# Patient Record
Sex: Female | Born: 1955
Health system: Southern US, Community
[De-identification: ages and names within clinical notes are randomized; demographics above are authoritative.]

## PROBLEM LIST (undated history)

## (undated) DIAGNOSIS — T7840XA Allergy, unspecified, initial encounter: Secondary | ICD-10-CM

## (undated) DIAGNOSIS — R87619 Unspecified abnormal cytological findings in specimens from cervix uteri: Secondary | ICD-10-CM

## (undated) DIAGNOSIS — G43909 Migraine, unspecified, not intractable, without status migrainosus: Secondary | ICD-10-CM

## (undated) DIAGNOSIS — E785 Hyperlipidemia, unspecified: Secondary | ICD-10-CM

## (undated) DIAGNOSIS — M1990 Unspecified osteoarthritis, unspecified site: Secondary | ICD-10-CM

## (undated) DIAGNOSIS — G43009 Migraine without aura, not intractable, without status migrainosus: Secondary | ICD-10-CM

## (undated) DIAGNOSIS — E049 Nontoxic goiter, unspecified: Secondary | ICD-10-CM

## (undated) DIAGNOSIS — E079 Disorder of thyroid, unspecified: Secondary | ICD-10-CM

## (undated) HISTORY — DX: Migraine without aura, not intractable, without status migrainosus: G43.009

## (undated) HISTORY — DX: Allergy, unspecified, initial encounter: T78.40XA

## (undated) HISTORY — DX: Nontoxic goiter, unspecified: E04.9

## (undated) HISTORY — DX: Disorder of thyroid, unspecified: E07.9

## (undated) HISTORY — DX: Unspecified osteoarthritis, unspecified site: M19.90

## (undated) HISTORY — PX: COLONOSCOPY: SHX174

## (undated) HISTORY — DX: Unspecified abnormal cytological findings in specimens from cervix uteri: R87.619

## (undated) HISTORY — DX: Migraine, unspecified, not intractable, without status migrainosus: G43.909

## (undated) HISTORY — DX: Hyperlipidemia, unspecified: E78.5

---

## 1988-03-04 HISTORY — PX: CERVIX LESION DESTRUCTION: SHX591

## 1999-05-09 ENCOUNTER — Ambulatory Visit (HOSPITAL_COMMUNITY): Admission: RE | Admit: 1999-05-09 | Discharge: 1999-05-09 | Payer: Self-pay | Admitting: *Deleted

## 1999-05-09 ENCOUNTER — Encounter: Payer: Self-pay | Admitting: *Deleted

## 2000-08-19 ENCOUNTER — Other Ambulatory Visit: Admission: RE | Admit: 2000-08-19 | Discharge: 2000-08-19 | Payer: Self-pay | Admitting: Radiology

## 2003-11-21 ENCOUNTER — Ambulatory Visit (HOSPITAL_COMMUNITY): Admission: RE | Admit: 2003-11-21 | Discharge: 2003-11-21 | Payer: Self-pay | Admitting: *Deleted

## 2004-06-18 ENCOUNTER — Other Ambulatory Visit: Admission: RE | Admit: 2004-06-18 | Discharge: 2004-06-18 | Payer: Self-pay | Admitting: *Deleted

## 2004-12-27 ENCOUNTER — Encounter: Admission: RE | Admit: 2004-12-27 | Discharge: 2004-12-27 | Payer: Self-pay | Admitting: *Deleted

## 2005-06-26 ENCOUNTER — Other Ambulatory Visit: Admission: RE | Admit: 2005-06-26 | Discharge: 2005-06-26 | Payer: Self-pay | Admitting: *Deleted

## 2006-02-05 ENCOUNTER — Encounter: Admission: RE | Admit: 2006-02-05 | Discharge: 2006-02-05 | Payer: Self-pay | Admitting: *Deleted

## 2006-07-30 ENCOUNTER — Other Ambulatory Visit: Admission: RE | Admit: 2006-07-30 | Discharge: 2006-07-30 | Payer: Self-pay | Admitting: *Deleted

## 2007-02-10 ENCOUNTER — Encounter: Admission: RE | Admit: 2007-02-10 | Discharge: 2007-02-10 | Payer: Self-pay | Admitting: *Deleted

## 2007-03-05 HISTORY — PX: COLONOSCOPY: SHX174

## 2007-11-20 ENCOUNTER — Ambulatory Visit: Payer: Self-pay | Admitting: Gastroenterology

## 2007-12-04 ENCOUNTER — Encounter: Payer: Self-pay | Admitting: Gastroenterology

## 2007-12-04 ENCOUNTER — Ambulatory Visit: Payer: Self-pay | Admitting: Gastroenterology

## 2007-12-07 ENCOUNTER — Encounter: Payer: Self-pay | Admitting: Gastroenterology

## 2008-03-15 ENCOUNTER — Encounter: Admission: RE | Admit: 2008-03-15 | Discharge: 2008-03-15 | Payer: Self-pay | Admitting: Internal Medicine

## 2009-04-20 ENCOUNTER — Encounter: Admission: RE | Admit: 2009-04-20 | Discharge: 2009-04-20 | Payer: Self-pay | Admitting: Internal Medicine

## 2010-03-04 HISTORY — PX: BLEPHAROPLASTY: SUR158

## 2010-03-25 ENCOUNTER — Encounter: Payer: Self-pay | Admitting: Internal Medicine

## 2010-05-09 ENCOUNTER — Other Ambulatory Visit: Payer: Self-pay | Admitting: Internal Medicine

## 2010-05-09 DIAGNOSIS — Z1239 Encounter for other screening for malignant neoplasm of breast: Secondary | ICD-10-CM

## 2010-05-17 ENCOUNTER — Ambulatory Visit
Admission: RE | Admit: 2010-05-17 | Discharge: 2010-05-17 | Disposition: A | Discharge status: Home / Self Care | Payer: 59 | Source: Ambulatory Visit | Attending: Internal Medicine | Admitting: Internal Medicine

## 2010-05-17 DIAGNOSIS — Z1239 Encounter for other screening for malignant neoplasm of breast: Secondary | ICD-10-CM

## 2011-06-18 ENCOUNTER — Other Ambulatory Visit: Payer: Self-pay | Admitting: Internal Medicine

## 2011-06-18 DIAGNOSIS — Z1231 Encounter for screening mammogram for malignant neoplasm of breast: Secondary | ICD-10-CM

## 2011-07-10 ENCOUNTER — Ambulatory Visit
Admission: RE | Admit: 2011-07-10 | Discharge: 2011-07-10 | Disposition: A | Discharge status: Home / Self Care | Payer: BC Managed Care – PPO | Source: Ambulatory Visit | Attending: Internal Medicine | Admitting: Internal Medicine

## 2011-07-10 DIAGNOSIS — Z1231 Encounter for screening mammogram for malignant neoplasm of breast: Secondary | ICD-10-CM

## 2012-10-02 ENCOUNTER — Telehealth: Payer: Self-pay | Admitting: Obstetrics & Gynecology

## 2012-10-02 ENCOUNTER — Ambulatory Visit (INDEPENDENT_AMBULATORY_CARE_PROVIDER_SITE_OTHER): Payer: BC Managed Care – PPO | Admitting: Obstetrics and Gynecology

## 2012-10-02 VITALS — BP 102/60 | HR 68 | Resp 12 | Ht 65.0 in | Wt 158.0 lb

## 2012-10-02 DIAGNOSIS — N898 Other specified noninflammatory disorders of vagina: Secondary | ICD-10-CM

## 2012-10-02 MED ORDER — ESTROGENS, CONJUGATED 0.625 MG/GM VA CREA: TOPICAL_CREAM | Freq: Every day | VAGINAL | Status: DC

## 2012-10-02 NOTE — Telephone Encounter (Signed)
Patient states having vaginal discharge . Is currently out of town and will be back in town @ 2:30pm. Appointment given for Dr. Tresa Res @ 3:15pm today.

## 2012-10-02 NOTE — Telephone Encounter (Signed)
Pt is having pain and discharge. Please call to schedule an appointment.

## 2012-10-02 NOTE — Progress Notes (Signed)
57 yo MWF with three week history of vaginal discharge and painful sex.  No HRT ever.  No new sex partner.  Has tried KY jelly and that works "for a while" and then sex is painful half way through the experience.  No h/o recurrent vaginitis. Last sexual activity one week ago.  Exam:  Ext nl.  Vag with decreased rugae; pink; cx appears nl;no excessive discharge.  BUS neg.  BM:  Uterus small, nt, mobile.  Adnexa neg.  Wet prep:  PH= 5.5.  Saline neg.  KOH neg.  A:  Vaginal atrophy  P:  Discussed dx with pt.  Options of tx discussed.  Rec:  Olive oil for lubrication.  Offered and accepted trial of vag E2 cream.  Prem cream 1/2 g qhs for 2 weeks then 2-3 times per week.  Questions answered.

## 2012-10-02 NOTE — Patient Instructions (Signed)
Use the estrogen cream inside your vagina as directed.  If after 8 weeks of use, you do not feel better, please come back in.

## 2012-10-05 ENCOUNTER — Ambulatory Visit: Payer: Self-pay | Admitting: Gynecology

## 2013-04-14 ENCOUNTER — Ambulatory Visit: Payer: Self-pay | Admitting: Obstetrics & Gynecology

## 2013-04-16 ENCOUNTER — Ambulatory Visit: Payer: Self-pay | Admitting: Obstetrics & Gynecology

## 2013-05-13 ENCOUNTER — Ambulatory Visit: Payer: Self-pay | Admitting: Obstetrics & Gynecology

## 2013-07-08 ENCOUNTER — Ambulatory Visit: Payer: Self-pay | Admitting: Obstetrics & Gynecology

## 2013-07-13 ENCOUNTER — Encounter: Payer: Self-pay | Admitting: Obstetrics & Gynecology

## 2013-07-13 ENCOUNTER — Ambulatory Visit (INDEPENDENT_AMBULATORY_CARE_PROVIDER_SITE_OTHER): Payer: BC Managed Care – PPO | Admitting: Obstetrics & Gynecology

## 2013-07-13 VITALS — BP 110/80 | HR 66 | Temp 98.6°F | Ht 65.5 in | Wt 168.0 lb

## 2013-07-13 DIAGNOSIS — N9089 Other specified noninflammatory disorders of vulva and perineum: Secondary | ICD-10-CM

## 2013-07-13 DIAGNOSIS — Z0000 Encounter for general adult medical examination without abnormal findings: Secondary | ICD-10-CM

## 2013-07-13 DIAGNOSIS — E039 Hypothyroidism, unspecified: Secondary | ICD-10-CM | POA: Insufficient documentation

## 2013-07-13 DIAGNOSIS — Z01419 Encounter for gynecological examination (general) (routine) without abnormal findings: Secondary | ICD-10-CM

## 2013-07-13 LAB — POCT URINALYSIS DIPSTICK
BILIRUBIN UA: NEGATIVE
Blood, UA: NEGATIVE
GLUCOSE UA: NEGATIVE
KETONES UA: NEGATIVE
Nitrite, UA: NEGATIVE
PH UA: 5.5
PROTEIN UA: NEGATIVE
UROBILINOGEN UA: NEGATIVE

## 2013-07-13 MED ORDER — ESTRADIOL 10 MCG VA TABS: 1.0000 | ORAL_TABLET | VAGINAL | Status: DC

## 2013-07-13 NOTE — Patient Instructions (Signed)

## 2013-07-13 NOTE — Progress Notes (Signed)
58 y.o. G0P0000 Married CaucasianF here for annual exam.  No vaginal bleeding since 2012.  Has a second home in [place], [place].  Labs with Dr. Brigitte Pulse.  Patient's last menstrual period was 12/03/2010.          Sexually active: yes  The current method of family planning is none.    Exercising: yes  walking and weights Smoker:  no  Health Maintenance: Pap:  01/09/12 WNL/negative HR HPV History of abnormal Pap:  Yes h/o cone bx, laser, and chemical tx MMG:  07/10/11-normal, pt aware due Colonoscopy:  2009-repeat in 10 years BMD:   2013 TDaP:  Up to date with Dr Brigitte Pulse Screening Labs: PCP, Hb today: PCP, Urine today: WBC-1+, PH-5.5   reports that she has never smoked. She has never used smokeless tobacco. She reports that she drinks about 1 - 1.5 ounces of alcohol per week. She reports that she does not use illicit drugs.  Past Medical History  Diagnosis Date  . Thyroid disease     hypothyroidism  . Migraine   . Abnormal Pap smear of cervix     chemicl tx, cone bx, and laser    Past Surgical History  Procedure Laterality Date  . Cervix lesion destruction  1990    chemical tx'd x2, cone bx and laser  . Thyroid goiter  2002    multinodular goiter--negative bx     Current Outpatient Prescriptions  Medication Sig Dispense Refill  . BIOTIN PO Take by mouth daily.      . fexofenadine (ALLEGRA) 180 MG tablet Take 180 mg by mouth as needed.      Marland Kitchen levothyroxine (SYNTHROID, LEVOTHROID) 125 MCG tablet Take 125 mcg by mouth daily before breakfast.      . conjugated estrogens (PREMARIN) vaginal cream Place vaginally daily. 1/2 g pv qhs x 2 wks, then 1/2 g pv 2-3 times q wk  30 g  6   No current facility-administered medications for this visit.    Family History  Problem Relation Age of Onset  . Parkinson's disease Brother     ROS:  Pertinent items are noted in HPI.  Otherwise, a comprehensive ROS was negative.  Exam:   BP 110/80  Pulse 66  Temp(Src) 98.6 F (37 C)  Ht 5'  5.5" (1.664 m)  Wt 168 lb (76.204 kg)  BMI 27.52 kg/m2  LMP 12/03/2010    Height: 5' 5.5" (166.4 cm)  Ht Readings from Last 3 Encounters:  07/13/13 5' 5.5" (1.664 m)  10/02/12 5\' 5"  (1.651 m)    General appearance: alert, cooperative and appears stated age Head: Normocephalic, without obvious abnormality, atraumatic Neck: no adenopathy, supple, symmetrical, trachea midline and thyroid normal to inspection and palpation Lungs: clear to auscultation bilaterally Breasts: normal appearance, no masses or tenderness Heart: regular rate and rhythm Abdomen: soft, non-tender; bowel sounds normal; no masses,  no organomegaly Extremities: extremities normal, atraumatic, no cyanosis or edema Skin: Skin color, texture, turgor normal. No rashes or lesions Lymph nodes: Cervical, supraclavicular, and axillary nodes normal. No abnormal inguinal nodes palpated Neurologic: Grossly normal   Pelvic: External genitalia:  no lesions except for wart like lesion below left labia majora              Urethra:  normal appearing urethra with no masses, tenderness or lesions              Bartholins and Skenes: normal  Vagina: normal appearing vagina with normal color and discharge, no lesions              Cervix: no lesions              Pap taken: no Bimanual Exam:  Uterus:  normal size, contour, position, consistency, mobility, non-tender              Adnexa: normal adnexa and no mass, fullness, tenderness               Rectovaginal: Confirms               Anus:  normal sphincter tone, no lesions  Vulvar biopsy note:  Verbal consent obtained.  Area cleansed with Betadine x 3.  1/2 cc 1% Lidocaine instilled.  Lesion excised with sterile technique.  Silver nitrate applied for hemostasis.  Pt tolerated procedure well.    A:  Well Woman with normal exam PMP, no HRT Remote hx of abnormal Pap smear, 20 years ago Hypothyroidism Sun damage Atypical migraine Vaginal atrophic changes  P:    Mammogram yearly.  Pt knows due.   pap smear with neg HR HPV 11/13 D/W pt other options for atrophic changes, beside estrogen cream, due to h/o atypical migraine.  Will try Vagifem 64meq tablets twice weekly.  Rx to pharmacy. Vulvar biopsy pending. return annually or prn  An After Visit Summary was printed and given to the patient.

## 2013-07-20 ENCOUNTER — Other Ambulatory Visit: Payer: Self-pay

## 2013-07-20 DIAGNOSIS — Z1231 Encounter for screening mammogram for malignant neoplasm of breast: Secondary | ICD-10-CM

## 2013-08-10 ENCOUNTER — Ambulatory Visit
Admission: RE | Admit: 2013-08-10 | Discharge: 2013-08-10 | Disposition: A | Discharge status: Home / Self Care | Payer: BC Managed Care – PPO | Source: Ambulatory Visit

## 2013-08-10 DIAGNOSIS — Z1231 Encounter for screening mammogram for malignant neoplasm of breast: Secondary | ICD-10-CM

## 2013-08-17 ENCOUNTER — Ambulatory Visit (INDEPENDENT_AMBULATORY_CARE_PROVIDER_SITE_OTHER): Payer: BC Managed Care – PPO | Admitting: Certified Nurse Midwife

## 2013-08-17 ENCOUNTER — Encounter: Payer: Self-pay | Admitting: Certified Nurse Midwife

## 2013-08-17 ENCOUNTER — Telehealth: Payer: Self-pay | Admitting: Obstetrics & Gynecology

## 2013-08-17 VITALS — BP 104/70 | HR 68 | Temp 98.2°F | Resp 16 | Ht 65.5 in | Wt 166.0 lb

## 2013-08-17 DIAGNOSIS — B3731 Acute candidiasis of vulva and vagina: Secondary | ICD-10-CM

## 2013-08-17 DIAGNOSIS — B373 Candidiasis of vulva and vagina: Secondary | ICD-10-CM

## 2013-08-17 DIAGNOSIS — N952 Postmenopausal atrophic vaginitis: Secondary | ICD-10-CM

## 2013-08-17 DIAGNOSIS — N390 Urinary tract infection, site not specified: Secondary | ICD-10-CM

## 2013-08-17 LAB — POCT URINALYSIS DIPSTICK
BILIRUBIN UA: NEGATIVE
GLUCOSE UA: NEGATIVE
Ketones, UA: NEGATIVE
Nitrite, UA: NEGATIVE
PH UA: 5
PROTEIN UA: NEGATIVE
UROBILINOGEN UA: NEGATIVE

## 2013-08-17 MED ORDER — TERCONAZOLE 0.4 % VA CREA: 1.0000 | TOPICAL_CREAM | Freq: Every day | VAGINAL | Status: DC

## 2013-08-17 MED ORDER — NITROFURANTOIN MONOHYD MACRO 100 MG PO CAPS
100.0000 mg | ORAL_CAPSULE | Freq: Two times a day (BID) | ORAL | Status: DC
Start: 2013-08-17 — End: 2013-11-26

## 2013-08-17 NOTE — Telephone Encounter (Signed)
Pt has a UTI and can only come in after 4 pm today

## 2013-08-17 NOTE — Telephone Encounter (Signed)
Spoke with patient. Patient states that she is having symptoms of a UTI and would like to come in to be seen as soon as possible. Patient states she lives five minutes away and could come now. Appointment scheduled at 9:15am with Regina Eck CNM today. Patient agreeable to date and time and states she will leave home now.  CC: Regina Eck CNM   Routing to provider for final review. Patient agreeable to disposition. Will close encounter

## 2013-08-17 NOTE — Patient Instructions (Signed)
Atrophic Vaginitis Atrophic vaginitis is a problem of low levels of estrogen in women. This problem can happen at any age. It is most common in women who have gone through menopause ("the change").  HOW WILL I KNOW IF I HAVE THIS PROBLEM? You may have:  Trouble with peeing (urinating), such as:  Going to the bathroom often.  A hard time holding your pee until you reach a bathroom.  Leaking pee.  Having pain when you pee.  Itching or a burning feeling.  Vaginal bleeding and spotting.  Pain during sex.  Dryness of the vagina.  A yellow, bad-smelling fluid (discharge) coming from the vagina. HOW WILL MY DOCTOR CHECK FOR THIS PROBLEM?  During your exam, your doctor will likely find the problem.  If there is a vaginal fluid, it may be checked for infection. HOW WILL THIS PROBLEM BE TREATED? Keep the vulvar skin as clean as possible. Moisturizers and lubricants can help with some of the symptoms. Estrogen replacement can help. There are 2 ways to take estrogen:  Systemic estrogen gets estrogen to your whole body. It takes many weeks or months before the symptoms get better.  You take an estrogen pill.  You use a skin patch. This is a patch that you put on your skin.  If you still have your uterus, your doctor may ask you to take a hormone. Talk to your doctor about the right medicine for you.  Estrogen cream.  This puts estrogen only at the part of your body where you apply it. The cream is put into the vagina or put on the vulvar skin. For some women, estrogen cream works faster than pills or the patch. CAN ALL WOMEN WITH THIS PROBLEM USE ESTROGEN? No. Women with certain types of cancer, liver problems, or problems with blood clots should not take estrogen. Your doctor can help you decide the best treatment for your symptoms. Document Released: 08/07/2007 Document Revised: 05/13/2011 Document Reviewed: 08/07/2007 [facility] Patient Information 2014 [place], [place]. Urinary  Tract Infection Urinary tract infections (UTIs) can develop anywhere along your urinary tract. Your urinary tract is your body's drainage system for removing wastes and extra water. Your urinary tract includes two kidneys, two ureters, a bladder, and a urethra. Your kidneys are a pair of bean-shaped organs. Each kidney is about the size of your fist. They are located below your ribs, one on each side of your spine. CAUSES Infections are caused by microbes, which are microscopic organisms, including fungi, viruses, and bacteria. These organisms are so small that they can only be seen through a microscope. Bacteria are the microbes that most commonly cause UTIs. SYMPTOMS  Symptoms of UTIs may vary by age and gender of the patient and by the location of the infection. Symptoms in young women typically include a frequent and intense urge to urinate and a painful, burning feeling in the bladder or urethra during urination. Older women and men are more likely to be tired, shaky, and weak and have muscle aches and abdominal pain. A fever may mean the infection is in your kidneys. Other symptoms of a kidney infection include pain in your back or sides below the ribs, nausea, and vomiting. DIAGNOSIS To diagnose a UTI, your caregiver will ask you about your symptoms. Your caregiver also will ask to provide a urine sample. The urine sample will be tested for bacteria and white blood cells. White blood cells are made by your body to help fight infection. TREATMENT  Typically, UTIs can be  treated with medication. Because most UTIs are caused by a bacterial infection, they usually can be treated with the use of antibiotics. The choice of antibiotic and length of treatment depend on your symptoms and the type of bacteria causing your infection. HOME CARE INSTRUCTIONS  If you were prescribed antibiotics, take them exactly as your caregiver instructs you. Finish the medication even if you feel better after you have only  taken some of the medication.  Drink enough water and fluids to keep your urine clear or pale yellow.  Avoid caffeine, tea, and carbonated beverages. They tend to irritate your bladder.  Empty your bladder often. Avoid holding urine for long periods of time.  Empty your bladder before and after sexual intercourse.  After a bowel movement, women should cleanse from front to back. Use each tissue only once. SEEK MEDICAL CARE IF:   You have back pain.  You develop a fever.  Your symptoms do not begin to resolve within 3 days. SEEK IMMEDIATE MEDICAL CARE IF:   You have severe back pain or lower abdominal pain.  You develop chills.  You have nausea or vomiting.  You have continued burning or discomfort with urination. MAKE SURE YOU:   Understand these instructions.  Will watch your condition.  Will get help right away if you are not doing well or get worse. Document Released: 11/28/2004 Document Revised: 08/20/2011 Document Reviewed: 03/29/2011 [facility] Patient Information 2014 [place].

## 2013-08-17 NOTE — Progress Notes (Signed)
58 y.o. married g0p0 here with complaint of UTI, with onset  on 08/17/13. Patient complaining of urinary frequency/urgency/ and pain with urination. Patient denies fever, chills, nausea or back pain. No new personal products. Patient feels not related to sexual activity. Complaining of vaginal symptoms of dryness with Vagifem use. No new personal products.  Menopausal no vaginal bleeding or HRT. Sexually active very rare.  O: Healthy female WDWN Affect: Normal, orientation x 3 Skin : warm and dry CVAT: negative bilateral Abdomen: positive for suprapubic tenderness  Pelvic exam: External genital area: normal, no lesions Bladder,Urethra, Urethral meatus: tender Vagina:thick vaginal discharge, atrophic appearance  Wet prep taken, ph 4.5 Cervix: normal, non tender Uterus:normal,non tender Adnexa: normal non tender, no fullness or masses   A: UTI Poct urine-rbc 2+, wbc 2+ Yeast vaginitis Atrophic vaginitis P: Reviewed findings of UTI Rx: Macrobid see order SLP:[id number] micro, culture Reviewed warning signs and symptoms of UTI Encouraged to limit soda, tea, and coffee and increase water Reviewed findings of yeast vaginitis and discussed dryness can encourage occurrence. Rx Terazol 7 cream see order. Resume Vagifem use as prescribed, in am during treatment.  RV 2 week if TOC needed for positive culture  RV prn

## 2013-08-18 LAB — URINALYSIS, MICROSCOPIC ONLY
Bacteria, UA: NONE SEEN
Casts: NONE SEEN
Crystals: NONE SEEN
Squamous Epithelial / LPF: NONE SEEN

## 2013-08-18 LAB — URINE CULTURE
Colony Count: NO GROWTH
ORGANISM ID, BACTERIA: NO GROWTH

## 2013-08-18 NOTE — Progress Notes (Signed)
Reviewed personally.  M. Suzanne Miller, MD.  

## 2013-11-03 ENCOUNTER — Telehealth: Payer: Self-pay | Admitting: Obstetrics & Gynecology

## 2013-11-03 NOTE — Telephone Encounter (Signed)
Pt says the vagifem is not working for her. Would like to speak with Dr Sabra Heck about the next step.

## 2013-11-03 NOTE — Telephone Encounter (Signed)
Spoke with patient. Patient states that she was previously on Premarin and was switched to Vagifem. "I don't think that it is working. I just want to come in to speak with Dr.Miller about my other options." Requesting appointment for the week of 9/21. Appointment scheduled for 9/25 at 3pm with Dr.Miller. Patient agreeable to date and time.  Routing to provider for final review. Patient agreeable to disposition. Will close encounter

## 2013-11-03 NOTE — Telephone Encounter (Signed)
Left message to call Cheria Sadiq at [phone]. 

## 2013-11-26 ENCOUNTER — Ambulatory Visit (INDEPENDENT_AMBULATORY_CARE_PROVIDER_SITE_OTHER): Payer: BC Managed Care – PPO | Admitting: Obstetrics & Gynecology

## 2013-11-26 VITALS — BP 110/72 | HR 64 | Resp 20 | Wt 170.8 lb

## 2013-11-26 DIAGNOSIS — IMO0002 Reserved for concepts with insufficient information to code with codable children: Secondary | ICD-10-CM

## 2013-11-26 DIAGNOSIS — N9489 Other specified conditions associated with female genital organs and menstrual cycle: Secondary | ICD-10-CM

## 2013-11-26 DIAGNOSIS — N898 Other specified noninflammatory disorders of vagina: Secondary | ICD-10-CM

## 2013-11-26 MED ORDER — OSPEMIFENE 60 MG PO TABS: 1.0000 | ORAL_TABLET | Freq: Every day | ORAL | Status: DC

## 2013-11-26 NOTE — Progress Notes (Deleted)
Subjective:     Patient ID: Olivia Ellison, female   DOB: Aug 17, 1955, 58 y.o.   MRN: [id number]  HPI   Review of Systems     Objective:   Physical Exam     Assessment:     ***    Plan:     ***

## 2013-11-28 ENCOUNTER — Encounter: Payer: Self-pay | Admitting: Obstetrics & Gynecology

## 2013-11-28 NOTE — Progress Notes (Signed)
58 yo G0 MWF who lives part time in [place] and part time in [place], [place], who is here for follow up to discussed change from estrogen vaginal cream to Vagifem.  This was done due to atypical migraines pt has been experiencing.  I jsut didn't feel as comfortable with an estrogen cream and risk of stroke.  Pt has faithfully tried vagifem and coconut oil use but this is not working well for her.  She has worse vaginal dryness and pain with intercourse.  She wants to discuss other options.  Osphena and estring discussed.  Use, mechanism of action, typical improvement, risks, all discussed.  Pt would like to try osphena.  Assessment:  Vaginal dryness Dyspareunia  Plan:  Trial of osphena for 12 weeks.  60mg  daily.  Rx given (for pt to check for best price) as well as to mail order.  Savings card given as well.  She will check around for pricing.    Pt to give update in 3 to 4 months or at [facility] f/u.

## 2014-02-01 ENCOUNTER — Telehealth: Payer: Self-pay

## 2014-02-01 MED ORDER — ESTRADIOL 2 MG VA RING: 2.0000 mg | VAGINAL_RING | VAGINAL | Status: DC

## 2014-02-01 NOTE — Telephone Encounter (Signed)
Osphena placed as allergy in patient's chart. Spoke with patient. Advised patient of message as seen below from [place]. Patient is agreeable. Patient would like rx printed so she can research cheapest area to have prescription filled. Prescription for Estring 2mg  #1 3RF printed and to Dr.Miller's desk for review and signature for patient pick up.  Routing to provider for final review. Patient agreeable to disposition. Will close encounter

## 2014-02-01 NOTE — Telephone Encounter (Signed)
Pt would like to speak to a nurse about side effect of a rx.

## 2014-02-01 NOTE — Telephone Encounter (Signed)
I think we should put Osphena as an allergy.  Ok to prescribe Estring 2mg  every three months.  Okay give refills through [facility].

## 2014-02-01 NOTE — Telephone Encounter (Signed)
Spoke with patient. Patient states that she has been taking Osphena for 6 weeks. Began to have muscle spasms in left eye then shortly after right eye. After a couple of weeks spasms began to occur in her hands and feet as well. "There is a history of Parkinson's in my family so I got worried. On Thanksgiving I stopped taking the Osphena and I have not had any spasms since." Patient is interested in switching over to the [place] and wanted Dr.Miller to know of side effects she has been having. Advised patient will send a message to Dr.Miller to let her know of side effects with Osphena and will return call with further recommendations. Patient is agreeable.

## 2014-07-19 ENCOUNTER — Ambulatory Visit (INDEPENDENT_AMBULATORY_CARE_PROVIDER_SITE_OTHER): Payer: BLUE CROSS/BLUE SHIELD | Admitting: Obstetrics & Gynecology

## 2014-07-19 ENCOUNTER — Encounter: Payer: Self-pay | Admitting: Obstetrics & Gynecology

## 2014-07-19 VITALS — BP 102/68 | HR 60 | Resp 12 | Ht 65.25 in | Wt 174.4 lb

## 2014-07-19 DIAGNOSIS — Z0000 Encounter for general adult medical examination without abnormal findings: Secondary | ICD-10-CM | POA: Diagnosis not present

## 2014-07-19 DIAGNOSIS — Z01419 Encounter for gynecological examination (general) (routine) without abnormal findings: Secondary | ICD-10-CM

## 2014-07-19 DIAGNOSIS — R350 Frequency of micturition: Secondary | ICD-10-CM

## 2014-07-19 DIAGNOSIS — Z124 Encounter for screening for malignant neoplasm of cervix: Secondary | ICD-10-CM | POA: Diagnosis not present

## 2014-07-19 LAB — POCT URINALYSIS DIPSTICK
BILIRUBIN UA: NEGATIVE
Glucose, UA: NEGATIVE
Ketones, UA: NEGATIVE
Leukocytes, UA: NEGATIVE
NITRITE UA: NEGATIVE
PH UA: 5
Protein, UA: NEGATIVE
RBC UA: NEGATIVE
Urobilinogen, UA: NEGATIVE

## 2014-07-19 MED ORDER — ESTRADIOL 2 MG VA RING: 2.0000 mg | VAGINAL_RING | VAGINAL | Status: DC

## 2014-07-19 MED ORDER — SULFAMETHOXAZOLE-TRIMETHOPRIM 800-160 MG PO TABS: 1.0000 | ORAL_TABLET | Freq: Two times a day (BID) | ORAL | Status: DC

## 2014-07-19 NOTE — Progress Notes (Signed)
59 y.o. G0P0000 MarriedCaucasianF here for annual exam.  No vaginal bleeding.  Having some increased urinary frequency and urgency in AM.  Would like urine tested today.   Using Estring for vaginal dryness.     Patient's last menstrual period was 12/03/2010.          Sexually active: Yes.    The current method of family planning is post menopausal status.    Exercising: Yes.    5 mile walk/daily and weights 2 x weekly Smoker:  no  Health Maintenance: Pap:  01/09/12 WNL/negative HR HPV History of abnormal Pap:  Yes h/o cone bx, laser, and chemical tx MMG:  08/10/13 3D-normal Colonoscopy:  2009-repeat in 10 years BMD:   12/15 Dr Isabelle Course reports normal TDaP:  UTD with Dr Brigitte Pulse Screening Labs: PCP, Hb today: PCP, Urine today: negative   reports that she has never smoked. She has never used smokeless tobacco. She reports that she drinks about 1.0 - 1.5 oz of alcohol per week. She reports that she does not use illicit drugs.  Past Medical History  Diagnosis Date  . Thyroid disease     hypothyroidism  . Migraine   . Abnormal Pap smear of cervix     chemicl tx, cone bx, and laser  . Goiter     multinodular goiter--negative bx     Past Surgical History  Procedure Laterality Date  . Cervix lesion destruction  1990    chemical tx'd x2, cone bx and laser  . Blepharoplasty  2012         Current Outpatient Prescriptions  Medication Sig Dispense Refill  . BIOTIN PO Take 5,000 mcg by mouth daily.     . Calcium Carbonate-Vitamin D (CALCIUM + D PO) Take by mouth. 600mg  and minerals    . estradiol (ESTRING) 2 MG vaginal ring Place 2 mg vaginally every 3 (three) months. follow package directions 1 each 3  . fexofenadine (ALLEGRA) 180 MG tablet Take 180 mg by mouth as needed.    Marland Kitchen levothyroxine (SYNTHROID, LEVOTHROID) 125 MCG tablet Take 125 mcg by mouth daily before breakfast.     No current facility-administered medications for this visit.    Family History  Problem Relation Age of  Onset  . Parkinson's disease Brother     ROS:  Pertinent items are noted in HPI.  Otherwise, a comprehensive ROS was negative.  Exam:   BP 102/68 mmHg  Pulse 60  Resp 12  Ht 5' 5.25" (1.657 m)  Wt 174 lb 6.4 oz (79.107 kg)  BMI 28.81 kg/m2  LMP 12/03/2010  Weight change: +6#   Height: 5' 5.25" (165.7 cm)  Ht Readings from Last 3 Encounters:  07/19/14 5' 5.25" (1.657 m)  08/17/13 5' 5.5" (1.664 m)  07/13/13 5' 5.5" (1.664 m)    General appearance: alert, cooperative and appears stated age Head: Normocephalic, without obvious abnormality, atraumatic Neck: no adenopathy, supple, symmetrical, trachea midline and thyroid normal to inspection and palpation Lungs: clear to auscultation bilaterally Breasts: normal appearance, no masses or tenderness Heart: regular rate and rhythm Abdomen: soft, non-tender; bowel sounds normal; no masses,  no organomegaly Extremities: extremities normal, atraumatic, no cyanosis or edema Skin: Skin color, texture, turgor normal. No rashes or lesions Lymph nodes: Cervical, supraclavicular, and axillary nodes normal. No abnormal inguinal nodes palpated Neurologic: Grossly normal   Pelvic: External genitalia:  no lesions              Urethra:  normal appearing urethra with no masses,  tenderness or lesions              Bartholins and Skenes: normal                 Vagina: normal appearing vagina with normal color and discharge, no lesions              Cervix: no lesions              Pap taken: Yes.   Bimanual Exam:  Uterus:  normal size, contour, position, consistency, mobility, non-tender              Adnexa: normal adnexa and no mass, fullness, tenderness               Rectovaginal: Confirms               Anus:  normal sphincter tone, no lesions  Chaperone was present for exam.  A:  Well Woman with normal exam PMP, no HRT Remote hx of abnormal Pap smear, 20 years ago Hypothyroidism Sun exposure Atypical migraine hx Vaginal atrophic  changes H/O excision of condyloma 2015 Increased urinary urgency/frequency in AM  P: Mammogram yearly. pap smear with neg HR HPV 11/13.  Pap today. Estring 2mg  pv every three months Labs with Dr. Brigitte Pulse yearly Urine culture pending.  Bactrim DS bid x 5 days rx to pharmacy if symptoms increase before culture is back return annually or prn

## 2014-07-21 ENCOUNTER — Telehealth: Payer: Self-pay

## 2014-07-21 LAB — IPS PAP TEST WITH REFLEX TO HPV

## 2014-07-21 LAB — URINE CULTURE
COLONY COUNT: NO GROWTH
ORGANISM ID, BACTERIA: NO GROWTH

## 2014-07-21 NOTE — Telephone Encounter (Signed)
-----   Message from Megan Salon, MD sent at 07/21/2014 12:25 PM EDT ----- Please inform urine culture is negative.  If symptoms continue (AM urgency) may need to consider a medication for overactive bladder.

## 2014-07-21 NOTE — Telephone Encounter (Signed)
Lmtcb//kn 

## 2014-07-25 NOTE — Telephone Encounter (Signed)
Patient notified of results-see result note.//kn

## 2014-08-05 ENCOUNTER — Other Ambulatory Visit: Payer: Self-pay

## 2014-08-05 DIAGNOSIS — Z1231 Encounter for screening mammogram for malignant neoplasm of breast: Secondary | ICD-10-CM

## 2014-08-08 ENCOUNTER — Telehealth: Payer: Self-pay | Admitting: Obstetrics & Gynecology

## 2014-08-08 NOTE — Telephone Encounter (Signed)
Prior authorization sent to covermymeds.  Patient previously tried and failed: Osphena, Premarin Cream, Vagifem Tablets. Patient with history of atypical migraine.   Key for request [id number]. Approved.  [employer], notified of approval.  Called patient and notified of approval. Advised [facility] attempted to process claim and it still required prior authorization. Advised make take a few days for information to be updated through [facility]. Patient agreeable.  Routing to provider for final review. Patient agreeable to disposition. Will close encounter.

## 2014-08-08 NOTE — Telephone Encounter (Signed)
Patient says her prescription for Estring requires prior authorization.  Confirmed pharmacy on file with patient. Lasts seen 07/19/2014.

## 2014-09-08 ENCOUNTER — Ambulatory Visit: Payer: Self-pay

## 2014-10-13 ENCOUNTER — Ambulatory Visit
Admission: RE | Admit: 2014-10-13 | Discharge: 2014-10-13 | Disposition: A | Discharge status: Home / Self Care | Payer: BLUE CROSS/BLUE SHIELD | Source: Ambulatory Visit

## 2014-10-13 DIAGNOSIS — Z1231 Encounter for screening mammogram for malignant neoplasm of breast: Secondary | ICD-10-CM

## 2015-01-19 ENCOUNTER — Telehealth: Payer: Self-pay | Admitting: Obstetrics & Gynecology

## 2015-01-19 NOTE — Telephone Encounter (Signed)
Take the Estring out and come in for an appt as soon as she can.

## 2015-01-19 NOTE — Telephone Encounter (Signed)
Spoke with patient. Patient is currently using Estring for vaginal dryness. Has been suing the Estring for 9 months. Replaced her last ring on 11/10/2014. States "I have not had any problems with it so far. Yesterday I noticed a little bit of bleeding and today I checked to see if it was in the right position and my finger was covered in blood." Denies any pelvic pain or discomfort. Denies urinary symptoms, lower back pain, fevers, or chills. Is out of town until December 1st. Patient is requesting to know what to do. Advised I will speak with Dr.Miller and return call with further recommendations. Patient is agreeable.

## 2015-01-19 NOTE — Telephone Encounter (Signed)
Spoke with patient. Advised of message as seen below from [place]. Patient is agreeable. Appointment scheduled for 02/03/2015 at 10 am with Dr.Miller. Agreeable to date and time. Will monitor symptoms. Aware if she develops increased bleeding or new symptoms will need to be seen locally out of town at Urgent Care or ER. Patient is agreeable and verbalizes understanding.  Routing to provider for final review. Patient agreeable to disposition. Will close encounter.

## 2015-01-19 NOTE — Telephone Encounter (Signed)
Patient called and said, "I am having bleeding on estring and I just need to know what to do." Paper chart to triage.

## 2015-02-03 ENCOUNTER — Encounter: Payer: Self-pay | Admitting: Obstetrics & Gynecology

## 2015-02-03 ENCOUNTER — Ambulatory Visit (INDEPENDENT_AMBULATORY_CARE_PROVIDER_SITE_OTHER): Payer: BLUE CROSS/BLUE SHIELD | Admitting: Obstetrics & Gynecology

## 2015-02-03 VITALS — BP 100/70 | HR 70 | Resp 16 | Ht 65.25 in | Wt 178.0 lb

## 2015-02-03 DIAGNOSIS — N952 Postmenopausal atrophic vaginitis: Secondary | ICD-10-CM

## 2015-02-03 DIAGNOSIS — N950 Postmenopausal bleeding: Secondary | ICD-10-CM

## 2015-02-03 MED ORDER — ESTRADIOL 2 MG VA RING: 2.0000 mg | VAGINAL_RING | VAGINAL | Status: DC

## 2015-02-03 NOTE — Progress Notes (Signed)
Subjective:     Patient ID: Olivia Ellison, female   DOB: Feb 27, 1956, 59 y.o.   MRN: [id number]  HPI Very nice 59 yo G0 MWF here for complaint of vaginal bleeding that she experienced aroudn 01/19/15.  She's been using an Estring for vaginal atrophic changes.  She does not want to be on HRT and has not had success with vaginal estrogens, vagifem, and Osphena.  She notice bleeding while in [place] and had no other symptoms. She was sure it was vaginal bleeding.  She removed her Estring and hasn't noted any additional bleeding.  Denies urinary changes, hematuria, or bowel changes.    Pt is doing much better from a dyspareunia standpoint with the Estring and this is really the only thing that has worked for her.    Review of Systems  All other systems reviewed and are negative.      Objective:   Physical Exam  Constitutional: She is oriented to person, place, and time. She appears well-developed and well-nourished.  Abdominal: Soft. Bowel sounds are normal. She exhibits no distension. There is no tenderness. There is no rebound and no guarding.  Genitourinary: Vagina normal and uterus normal. There is no rash, tenderness or lesion on the right labia. There is no rash, tenderness or lesion on the left labia. Cervix exhibits no motion tenderness. Right adnexum displays no mass, no tenderness and no fullness. Left adnexum displays no mass, no tenderness and no fullness. No tenderness or bleeding in the vagina. No foreign body around the vagina. No signs of injury around the vagina. No vaginal discharge found.  Lymphadenopathy:       Right: No inguinal adenopathy present.       Left: No inguinal adenopathy present.  Neurological: She is alert and oriented to person, place, and time.  Skin: Skin is warm and dry.  Psychiatric: She has a normal mood and affect.   There was no evidence of bleeding on examination.  However, I felt endometrial biopsy was prudent.  Discussed with patient.  Verbal and  written consent obtained.   Procedure:  Speculum placed.  Cervix visualized and cleansed with betadine prep.  A single toothed tenaculum was applied to the anterior lip of the cervix.  Dilation of the cervix was necessary with Milex dilator.  Endometrial pipelle was advanced through the cervix into the endometrial cavity without difficulty.  Pipelle passed to 7cm.  Suction applied and pipelle removed with scant tissue sample obtained.  Second pass performed.  Tenculum removed.  No bleeding noted.  Patient tolerated procedure well.     Assessment:     PMP bleeding with Estring use Vaginal atrophic changes  Dyparuenia    Plan:     Endometrial biopsy results will be called to the pt.   Pt will restart her Estring use.  Rx provided.  Given the fact this pt has tried many different options without much success and is doing much better with the Estring, I really think it is worth restarting and seeing how she does with this before recommending cessation and really impacting her sex life with her spouse.  Pt in agreement.

## 2015-02-09 ENCOUNTER — Telehealth: Payer: Self-pay | Admitting: Emergency Medicine

## 2015-02-09 NOTE — Telephone Encounter (Signed)
Call to patient and message from Dr. Sabra Heck given. Patient agreeable and will call back with any further bleeding for office visit with Dr. Sabra Heck.  Routing to provider for final review. Patient agreeable to disposition. Will close encounter.

## 2015-02-09 NOTE — Telephone Encounter (Signed)
-----   Message from Megan Salon, MD sent at 02/08/2015  7:46 AM EST ----- Please inform pt that her endometrial biopsy was negative for abnormal cells.  She had some PMP bleeding with her estring.  For now, it is ok to restart the estring.  We discussed this at her visit.  She needs to call if bleeding starts again for OV.

## 2015-08-01 DIAGNOSIS — Z0000 Encounter for general adult medical examination without abnormal findings: Secondary | ICD-10-CM | POA: Diagnosis not present

## 2015-08-01 DIAGNOSIS — M859 Disorder of bone density and structure, unspecified: Secondary | ICD-10-CM | POA: Diagnosis not present

## 2015-08-01 DIAGNOSIS — R8290 Unspecified abnormal findings in urine: Secondary | ICD-10-CM | POA: Diagnosis not present

## 2015-08-01 DIAGNOSIS — E042 Nontoxic multinodular goiter: Secondary | ICD-10-CM | POA: Diagnosis not present

## 2015-08-01 DIAGNOSIS — N390 Urinary tract infection, site not specified: Secondary | ICD-10-CM | POA: Diagnosis not present

## 2015-08-08 DIAGNOSIS — E784 Other hyperlipidemia: Secondary | ICD-10-CM | POA: Diagnosis not present

## 2015-08-08 DIAGNOSIS — Z0000 Encounter for general adult medical examination without abnormal findings: Secondary | ICD-10-CM | POA: Diagnosis not present

## 2015-08-08 DIAGNOSIS — M859 Disorder of bone density and structure, unspecified: Secondary | ICD-10-CM | POA: Diagnosis not present

## 2015-08-08 DIAGNOSIS — Z1389 Encounter for screening for other disorder: Secondary | ICD-10-CM | POA: Diagnosis not present

## 2015-08-10 ENCOUNTER — Ambulatory Visit (INDEPENDENT_AMBULATORY_CARE_PROVIDER_SITE_OTHER): Payer: BLUE CROSS/BLUE SHIELD | Admitting: Obstetrics & Gynecology

## 2015-08-10 ENCOUNTER — Encounter: Payer: Self-pay | Admitting: Obstetrics & Gynecology

## 2015-08-10 VITALS — BP 120/80 | HR 60 | Resp 12 | Ht 65.5 in | Wt 177.0 lb

## 2015-08-10 DIAGNOSIS — Z20828 Contact with and (suspected) exposure to other viral communicable diseases: Secondary | ICD-10-CM | POA: Diagnosis not present

## 2015-08-10 DIAGNOSIS — Z205 Contact with and (suspected) exposure to viral hepatitis: Secondary | ICD-10-CM | POA: Diagnosis not present

## 2015-08-10 DIAGNOSIS — Z1212 Encounter for screening for malignant neoplasm of rectum: Secondary | ICD-10-CM | POA: Diagnosis not present

## 2015-08-10 DIAGNOSIS — Z01419 Encounter for gynecological examination (general) (routine) without abnormal findings: Secondary | ICD-10-CM

## 2015-08-10 MED ORDER — ESTRADIOL 2 MG VA RING: 2.0000 mg | VAGINAL_RING | VAGINAL | Status: DC

## 2015-08-10 NOTE — Progress Notes (Signed)
60 y.o. G0P0000 MarriedCaucasianF here for annual exam.  Doing well.  Back in [place] until the early fall.  Hasn't had any more bleeding since December.    PCP:  Dr. Brigitte Pulse.    Patient's last menstrual period was 12/03/2010.          Sexually active: Yes.    The current method of family planning is post menopausal status.    Exercising: Yes.    Walk, weights Smoker:  no  Health Maintenance: Pap:  07/19/14-WNL, 11/13 neg pap with neg HR HPV History of abnormal Pap: Yes MMG:  10/14/14-BIRADS 1  Colonoscopy:  12/04/2007-WNL. Repeat 10 yrs BMD:   2016 TDaP: Per pt, still up-to-date Pneumonia vaccine(s):  No Zostavax:   2016 Hep C testing: No Screening Labs: PCP, Hb today: PCP, Urine today: No   reports that she has never smoked. She has never used smokeless tobacco. She reports that she drinks about 1.2 - 1.8 oz of alcohol per week. She reports that she does not use illicit drugs.  Past Medical History  Diagnosis Date  . Thyroid disease     hypothyroidism  . Migraine   . Abnormal Pap smear of cervix     chemicl tx, cone bx, and laser  . Goiter     multinodular goiter--negative bx     Past Surgical History  Procedure Laterality Date  . Cervix lesion destruction  1990    chemical tx'd x2, cone bx and laser  . Blepharoplasty  2012         Current Outpatient Prescriptions  Medication Sig Dispense Refill  . Ascorbic Acid (VITAMIN C PO) Take by mouth daily.    Marland Kitchen BIOTIN PO Take 5,000 mcg by mouth daily.     . Calcium Carbonate-Vitamin D (CALCIUM + D PO) Take by mouth. 600mg  and minerals    . Cholecalciferol (VITAMIN D PO) Take by mouth daily.    . Cyanocobalamin (VITAMIN B 12 PO) Take by mouth daily.    Marland Kitchen estradiol (ESTRING) 2 MG vaginal ring Place 2 mg vaginally every 3 (three) months. 1 each 3  . fexofenadine (ALLEGRA) 180 MG tablet Take 180 mg by mouth as needed.    Marland Kitchen levothyroxine (SYNTHROID, LEVOTHROID) 125 MCG tablet Take 125 mcg by mouth daily before breakfast.    . Multiple  Vitamins-Minerals (MULTIVITAMIN PO) Take by mouth daily.    . Probiotic Product (PROBIOTIC PO) Take by mouth daily.     No current facility-administered medications for this visit.    Family History  Problem Relation Age of Onset  . Parkinson's disease Brother     ROS:  Pertinent items are noted in HPI.  Otherwise, a comprehensive ROS was negative.  Exam:   BP 120/80 mmHg  Pulse 60  Resp 12  Ht 5' 5.5" (1.664 m)  Wt 177 lb (80.287 kg)  BMI 29.00 kg/m2  LMP 12/03/2010  Height: 5' 5.5" (166.4 cm)  Ht Readings from Last 3 Encounters:  08/10/15 5' 5.5" (1.664 m)  02/03/15 5' 5.25" (1.657 m)  07/19/14 5' 5.25" (1.657 m)   General appearance: alert, cooperative and appears stated age Head: Normocephalic, without obvious abnormality, atraumatic Neck: no adenopathy, supple, symmetrical, trachea midline and thyroid normal to inspection and palpation Lungs: clear to auscultation bilaterally Breasts: normal appearance, no masses or tenderness Heart: regular rate and rhythm Abdomen: soft, non-tender; bowel sounds normal; no masses,  no organomegaly Extremities: extremities normal, atraumatic, no cyanosis or edema Skin: Skin color, texture, turgor normal. No rashes  or lesions Lymph nodes: Cervical, supraclavicular, and axillary nodes normal. No abnormal inguinal nodes palpated Neurologic: Grossly normal   Pelvic: External genitalia:  no lesions              Urethra:  normal appearing urethra with no masses, tenderness or lesions              Bartholins and Skenes: normal                 Vagina: normal appearing vagina with normal color and discharge, no lesions              Cervix: no lesions              Pap taken: No. Bimanual Exam:  Uterus:  normal size, contour, position, consistency, mobility, non-tender              Adnexa: normal adnexa and no mass, fullness, tenderness               Rectovaginal: Confirms               Anus:  normal sphincter tone, no lesions  Chaperone  was present for exam.   A: Well Woman with normal exam PMP, no HRT Remote hx of abnormal Pap smear, 20 years ago Hypothyroidism Sun exposure Atypical migraine hx Vaginal atrophic changes H/O excision of condyloma 2015  P: Mammogram yearly. pap smear with neg HR HPV 11/13. Pap today. Estring 2mg  pv every three months.  Rx to pt. Labs with Dr. Brigitte Pulse yearly but will do hep C testing today Return annually or prn

## 2015-08-11 LAB — HEPATITIS C ANTIBODY: HCV AB: NEGATIVE

## 2015-09-19 DIAGNOSIS — M25552 Pain in left hip: Secondary | ICD-10-CM | POA: Diagnosis not present

## 2015-09-19 DIAGNOSIS — M545 Low back pain: Secondary | ICD-10-CM | POA: Diagnosis not present

## 2015-09-19 DIAGNOSIS — M9901 Segmental and somatic dysfunction of cervical region: Secondary | ICD-10-CM | POA: Diagnosis not present

## 2015-10-05 ENCOUNTER — Ambulatory Visit: Payer: BLUE CROSS/BLUE SHIELD | Admitting: Obstetrics & Gynecology

## 2015-10-11 ENCOUNTER — Other Ambulatory Visit: Payer: Self-pay | Admitting: Obstetrics & Gynecology

## 2015-10-11 NOTE — Telephone Encounter (Signed)
Patient is asking for refill of Estring. Patient is requesting a paper prescription to be mailed to her and she will send to the pharmacy in [place].Marland Kitchen

## 2015-10-11 NOTE — Telephone Encounter (Signed)
Medication refill request: Estradiol Last AEX:  08/10/15 SM Next AEX: 914/18 SM Last MMG (if hormonal medication request): 11/13/14 BIRADS1 Refill authorized: 08/10/15 #1 Ring 4R. Patient called and stated she needs a paper prescription. She has found a pharmacy in [facility] that is cheaper. Patient will be leaving to go back to [facility] in October and would like to take it then. Please advise. Thank you.

## 2015-10-13 MED ORDER — ESTRADIOL 2 MG VA RING
2.0000 mg | VAGINAL_RING | VAGINAL | 4 refills | Status: DC
Start: 2015-10-13 — End: 2016-10-01

## 2015-11-09 DIAGNOSIS — B078 Other viral warts: Secondary | ICD-10-CM | POA: Diagnosis not present

## 2015-11-28 DIAGNOSIS — Z23 Encounter for immunization: Secondary | ICD-10-CM | POA: Diagnosis not present

## 2015-12-14 ENCOUNTER — Other Ambulatory Visit: Payer: Self-pay | Admitting: Internal Medicine

## 2015-12-14 DIAGNOSIS — Z1231 Encounter for screening mammogram for malignant neoplasm of breast: Secondary | ICD-10-CM

## 2015-12-21 DIAGNOSIS — M545 Low back pain: Secondary | ICD-10-CM | POA: Diagnosis not present

## 2015-12-21 DIAGNOSIS — M25552 Pain in left hip: Secondary | ICD-10-CM | POA: Diagnosis not present

## 2015-12-21 DIAGNOSIS — M9901 Segmental and somatic dysfunction of cervical region: Secondary | ICD-10-CM | POA: Diagnosis not present

## 2016-02-06 ENCOUNTER — Ambulatory Visit
Admission: RE | Admit: 2016-02-06 | Discharge: 2016-02-06 | Disposition: A | Discharge status: Home / Self Care | Payer: BLUE CROSS/BLUE SHIELD | Source: Ambulatory Visit | Attending: Internal Medicine | Admitting: Internal Medicine

## 2016-02-06 DIAGNOSIS — Z1231 Encounter for screening mammogram for malignant neoplasm of breast: Secondary | ICD-10-CM

## 2016-02-12 DIAGNOSIS — Z85828 Personal history of other malignant neoplasm of skin: Secondary | ICD-10-CM | POA: Diagnosis not present

## 2016-02-12 DIAGNOSIS — L821 Other seborrheic keratosis: Secondary | ICD-10-CM | POA: Diagnosis not present

## 2016-02-12 DIAGNOSIS — I788 Other diseases of capillaries: Secondary | ICD-10-CM | POA: Diagnosis not present

## 2016-02-12 DIAGNOSIS — L570 Actinic keratosis: Secondary | ICD-10-CM | POA: Diagnosis not present

## 2016-02-12 DIAGNOSIS — B078 Other viral warts: Secondary | ICD-10-CM | POA: Diagnosis not present

## 2016-02-12 DIAGNOSIS — L814 Other melanin hyperpigmentation: Secondary | ICD-10-CM | POA: Diagnosis not present

## 2016-03-11 DIAGNOSIS — R300 Dysuria: Secondary | ICD-10-CM | POA: Diagnosis not present

## 2016-06-12 DIAGNOSIS — M25552 Pain in left hip: Secondary | ICD-10-CM | POA: Diagnosis not present

## 2016-06-12 DIAGNOSIS — M9901 Segmental and somatic dysfunction of cervical region: Secondary | ICD-10-CM | POA: Diagnosis not present

## 2016-06-12 DIAGNOSIS — M545 Low back pain: Secondary | ICD-10-CM | POA: Diagnosis not present

## 2016-08-12 DIAGNOSIS — R300 Dysuria: Secondary | ICD-10-CM | POA: Diagnosis not present

## 2016-08-12 DIAGNOSIS — M859 Disorder of bone density and structure, unspecified: Secondary | ICD-10-CM | POA: Diagnosis not present

## 2016-08-12 DIAGNOSIS — Z0000 Encounter for general adult medical examination without abnormal findings: Secondary | ICD-10-CM | POA: Diagnosis not present

## 2016-08-12 DIAGNOSIS — E042 Nontoxic multinodular goiter: Secondary | ICD-10-CM | POA: Diagnosis not present

## 2016-08-19 DIAGNOSIS — E042 Nontoxic multinodular goiter: Secondary | ICD-10-CM | POA: Diagnosis not present

## 2016-08-19 DIAGNOSIS — Z0000 Encounter for general adult medical examination without abnormal findings: Secondary | ICD-10-CM | POA: Diagnosis not present

## 2016-08-19 DIAGNOSIS — E784 Other hyperlipidemia: Secondary | ICD-10-CM | POA: Diagnosis not present

## 2016-08-19 DIAGNOSIS — M859 Disorder of bone density and structure, unspecified: Secondary | ICD-10-CM | POA: Diagnosis not present

## 2016-08-19 DIAGNOSIS — R030 Elevated blood-pressure reading, without diagnosis of hypertension: Secondary | ICD-10-CM | POA: Diagnosis not present

## 2016-08-21 DIAGNOSIS — Z1212 Encounter for screening for malignant neoplasm of rectum: Secondary | ICD-10-CM | POA: Diagnosis not present

## 2016-08-22 DIAGNOSIS — H10413 Chronic giant papillary conjunctivitis, bilateral: Secondary | ICD-10-CM | POA: Diagnosis not present

## 2016-08-22 DIAGNOSIS — H04123 Dry eye syndrome of bilateral lacrimal glands: Secondary | ICD-10-CM | POA: Diagnosis not present

## 2016-08-22 DIAGNOSIS — H2513 Age-related nuclear cataract, bilateral: Secondary | ICD-10-CM | POA: Diagnosis not present

## 2016-08-22 DIAGNOSIS — H40013 Open angle with borderline findings, low risk, bilateral: Secondary | ICD-10-CM | POA: Diagnosis not present

## 2016-08-26 DIAGNOSIS — D220 Melanocytic nevi of lip: Secondary | ICD-10-CM | POA: Diagnosis not present

## 2016-08-26 DIAGNOSIS — L821 Other seborrheic keratosis: Secondary | ICD-10-CM | POA: Diagnosis not present

## 2016-08-26 DIAGNOSIS — Z85828 Personal history of other malignant neoplasm of skin: Secondary | ICD-10-CM | POA: Diagnosis not present

## 2016-08-26 DIAGNOSIS — L814 Other melanin hyperpigmentation: Secondary | ICD-10-CM | POA: Diagnosis not present

## 2016-08-26 DIAGNOSIS — L570 Actinic keratosis: Secondary | ICD-10-CM | POA: Diagnosis not present

## 2016-08-26 DIAGNOSIS — B078 Other viral warts: Secondary | ICD-10-CM | POA: Diagnosis not present

## 2016-09-03 ENCOUNTER — Encounter: Payer: Self-pay | Admitting: Obstetrics & Gynecology

## 2016-09-05 ENCOUNTER — Ambulatory Visit (INDEPENDENT_AMBULATORY_CARE_PROVIDER_SITE_OTHER): Payer: BLUE CROSS/BLUE SHIELD | Admitting: Obstetrics & Gynecology

## 2016-09-05 ENCOUNTER — Telehealth: Payer: Self-pay | Admitting: Obstetrics & Gynecology

## 2016-09-05 ENCOUNTER — Encounter: Payer: Self-pay | Admitting: Obstetrics & Gynecology

## 2016-09-05 VITALS — BP 100/68 | HR 76 | Resp 16 | Ht 65.5 in | Wt 178.0 lb

## 2016-09-05 DIAGNOSIS — N898 Other specified noninflammatory disorders of vagina: Secondary | ICD-10-CM | POA: Diagnosis not present

## 2016-09-05 DIAGNOSIS — R8290 Unspecified abnormal findings in urine: Secondary | ICD-10-CM

## 2016-09-05 LAB — POCT URINALYSIS DIPSTICK
BILIRUBIN UA: NEGATIVE
GLUCOSE UA: NEGATIVE
KETONES UA: NEGATIVE
NITRITE UA: NEGATIVE
Protein, UA: NEGATIVE
RBC UA: NEGATIVE
Urobilinogen, UA: 0.2 E.U./dL
pH, UA: 5 (ref 5.0–8.0)

## 2016-09-05 MED ORDER — METRONIDAZOLE 0.75 % VA GEL: 1.0000 | Freq: Every day | VAGINAL | 0 refills | Status: DC

## 2016-09-05 NOTE — Telephone Encounter (Signed)
Patient calling regarding the mychart message she sent to dr Sabra Heck about coming in an leaving a urine sample.

## 2016-09-05 NOTE — Telephone Encounter (Signed)
Olivia Salon, MD  P [facility] Triage Pool        Pt left mychart message about coming to leave a urine sample for a culture due to abnormal odor. It is fine if she wants an appt or just wants to leave a urine sample. I'd prefer an appt if possible with the pt's schedule. Thanks.    Spoke with patient. Advised of message as seen from [place]. Patient verbalizes understanding. Appointment scheduled for 09/05/2016 at 3:15 pm with Dr.Miller.  Routing to provider for final review. Patient agreeable to disposition. Will close encounter.

## 2016-09-05 NOTE — Progress Notes (Signed)
GYNECOLOGY  VISIT   HPI: 61 y.o. G12P0000 Married Caucasian female here for several month complaint of foul smelling urine.  Pt reports she's discussed this with her PCP and two urine tests have been done.  These were negative.  Pt is requesting a urine culture as well.  Denies dysuria, back pain, hematuria, or urinary urgency/frequency.  She denies vaginal discharge or vaginal bleeding.  Using estring.  Marland Kitchen  GYNECOLOGIC HISTORY: Patient's last menstrual period was 12/03/2010. Contraception:  none Menopausal hormone therapy: none  Patient Active Problem List   Diagnosis Date Noted  . Atrophic vaginitis 02/03/2015  . Unspecified hypothyroidism 07/13/2013    Past Medical History:  Diagnosis Date  . Abnormal Pap smear of cervix    chemicl tx, cone bx, and laser  . Goiter    multinodular goiter--negative bx   . Migraine   . Thyroid disease    hypothyroidism    Past Surgical History:  Procedure Laterality Date  . BLEPHAROPLASTY  2012      . CERVIX LESION DESTRUCTION  1990   chemical tx'd x2, cone bx and laser    MEDS:  Reviewed in EPIC and UTD  ALLERGIES: Osphena [ospemifene] and Other  Family History  Problem Relation Age of Onset  . Parkinson's disease Brother     SH:  Married, non smoker  Review of Systems  All other systems reviewed and are negative.   PHYSICAL EXAMINATION:    BP 100/68 (BP Location: Right Arm, Patient Position: Sitting, Cuff Size: Normal)   Pulse 76   Resp 16   Ht 5' 5.5" (1.664 m)   Wt 178 lb (80.7 kg)   LMP 12/03/2010   BMI 29.17 kg/m     General appearance: alert, cooperative and appears stated age Abdomen: soft, non-tender; bowel sounds normal; no masses,  no organomegaly  Pelvic: External genitalia:  no lesions              Urethra:  normal appearing urethra with no masses, tenderness or lesions              Bartholins and Skenes: normal                 Vagina: normal appearing vagina significant odor noted, estring removed, swab  obtained              Cervix: no lesions              Bimanual Exam:  Uterus:  normal size, contour, position, consistency, mobility, non-tender              Adnexa: normal adnexa              Anus:   no lesions  Chaperone was present for exam.  Assessment: Significant urinary odor to pt Vaginal odor on exam today  Plan: Urine culture pending Vaginal nuswab testing performed today

## 2016-09-07 LAB — URINE CULTURE

## 2016-09-08 ENCOUNTER — Encounter: Payer: Self-pay | Admitting: Obstetrics & Gynecology

## 2016-09-11 ENCOUNTER — Encounter: Payer: Self-pay | Admitting: Obstetrics & Gynecology

## 2016-09-11 ENCOUNTER — Other Ambulatory Visit: Payer: Self-pay | Admitting: Obstetrics & Gynecology

## 2016-09-11 ENCOUNTER — Telehealth: Payer: Self-pay

## 2016-09-11 MED ORDER — SULFAMETHOXAZOLE-TRIMETHOPRIM 800-160 MG PO TABS: 1.0000 | ORAL_TABLET | Freq: Two times a day (BID) | ORAL | 0 refills | Status: DC

## 2016-09-11 NOTE — Telephone Encounter (Signed)
Notes recorded by Megan Salon, MD on 09/11/2016 at 10:19 AM EDT Called pt personally. Rx for bactrim DS bid x 5 days has been sent to [facility] on file. Pt will return for AEX and I will repeat urine culture at that visit.  Encounter closed.

## 2016-09-11 NOTE — Telephone Encounter (Signed)
It looks like Dr Sabra Heck sent in a script for Bactrim. Per the result note she spoke with her.

## 2016-09-11 NOTE — Telephone Encounter (Signed)
Visit Follow-Up Question  Message [id number]  From MARESSA APOLLO To Megan Salon, MD Sent 09/11/2016 6:44 AM  Hi Dr Sabra Heck,  I am assuming my urine culture came back negative since I have not heard anything from the office. I have used the [employer] for the 5 nights and the smell has gotten a little less, but it is still not a normal urine smell. Anything else I can do?  Thank you,  Collie Siad   Responsible Party   Pool - [facility] Clinical Pool No one has taken responsibility for this message.  No actions have been taken on this message.   Per review of urine culture from 09/05/2016. Returned showing E.Coli. Okay to start patient on Bactrim DS BID?

## 2016-09-13 LAB — NUSWAB VAGINITIS PLUS (VG+)
CANDIDA ALBICANS, NAA: NEGATIVE
CANDIDA GLABRATA, NAA: NEGATIVE
CHLAMYDIA TRACHOMATIS, NAA: NEGATIVE
NEISSERIA GONORRHOEAE, NAA: NEGATIVE
TRICH VAG BY NAA: NEGATIVE

## 2016-09-27 DIAGNOSIS — M25552 Pain in left hip: Secondary | ICD-10-CM | POA: Diagnosis not present

## 2016-09-27 DIAGNOSIS — M9901 Segmental and somatic dysfunction of cervical region: Secondary | ICD-10-CM | POA: Diagnosis not present

## 2016-09-27 DIAGNOSIS — M545 Low back pain: Secondary | ICD-10-CM | POA: Diagnosis not present

## 2016-10-01 ENCOUNTER — Ambulatory Visit (INDEPENDENT_AMBULATORY_CARE_PROVIDER_SITE_OTHER): Payer: BLUE CROSS/BLUE SHIELD | Admitting: Obstetrics & Gynecology

## 2016-10-01 ENCOUNTER — Other Ambulatory Visit (HOSPITAL_COMMUNITY)
Admission: RE | Admit: 2016-10-01 | Discharge: 2016-10-01 | Disposition: A | Discharge status: Home / Self Care | Payer: BLUE CROSS/BLUE SHIELD | Source: Ambulatory Visit | Attending: Obstetrics & Gynecology | Admitting: Obstetrics & Gynecology

## 2016-10-01 ENCOUNTER — Encounter: Payer: Self-pay | Admitting: Obstetrics & Gynecology

## 2016-10-01 VITALS — BP 128/86 | HR 64 | Resp 16 | Ht 65.25 in | Wt 182.0 lb

## 2016-10-01 DIAGNOSIS — Z01419 Encounter for gynecological examination (general) (routine) without abnormal findings: Secondary | ICD-10-CM | POA: Diagnosis not present

## 2016-10-01 DIAGNOSIS — Z124 Encounter for screening for malignant neoplasm of cervix: Secondary | ICD-10-CM

## 2016-10-01 DIAGNOSIS — R8290 Unspecified abnormal findings in urine: Secondary | ICD-10-CM | POA: Diagnosis not present

## 2016-10-01 MED ORDER — ESTRADIOL 2 MG VA RING
2.0000 mg | VAGINAL_RING | VAGINAL | 4 refills | Status: DC
Start: 2016-10-01 — End: 2017-10-24

## 2016-10-01 NOTE — Addendum Note (Signed)
Addended by: Megan Salon on: 10/01/2016 02:05 PM   Modules accepted: Orders

## 2016-10-01 NOTE — Progress Notes (Addendum)
61 y.o. G0P0000 MarriedCaucasianF here for annual exam.  Doing well.  Had abnormal urine odor.  This resolved with antibiotics after her urine culture was positive.   Patient's last menstrual period was 12/03/2010.          Sexually active: Yes.    The current method of family planning is post menopausal status.    Exercising: Yes.    walk Smoker:  no  Health Maintenance: Pap:  07/19/14 Neg.  01/2012 Neg. HR HPV:neg  History of abnormal Pap:  Yes, remote hx MMG:  02/06/16 BIRADS1:neg, Grade a breast density Colonoscopy:  12/04/07 polyps. F/u 10 years  BMD:  08/26/2016 normal TDaP:  Current  Pneumonia vaccine(s):  No Zostavax: 2016 Hep C testing: 08/10/15 neg Screening Labs: PCP, Urine today: culture    reports that she has never smoked. She has never used smokeless tobacco. She reports that she drinks about 1.2 - 1.8 oz of alcohol per week . She reports that she does not use drugs.  Past Medical History:  Diagnosis Date  . Abnormal Pap smear of cervix    chemicl tx, cone bx, and laser  . Goiter    multinodular goiter--negative bx   . Migraine   . Thyroid disease    hypothyroidism    Past Surgical History:  Procedure Laterality Date  . BLEPHAROPLASTY  2012      . CERVIX LESION DESTRUCTION  1990   chemical tx'd x2, cone bx and laser    Current Outpatient Prescriptions  Medication Sig Dispense Refill  . Ascorbic Acid (VITAMIN C PO) Take by mouth daily.    Marland Kitchen BIOTIN PO Take 5,000 mcg by mouth daily.     . Calcium Carbonate-Vitamin D (CALCIUM + D PO) Take by mouth. 600mg  and minerals    . Cholecalciferol (VITAMIN D PO) Take by mouth daily.    . Cyanocobalamin (VITAMIN B 12 PO) Take by mouth daily.    Marland Kitchen estradiol (ESTRING) 2 MG vaginal ring Place 2 mg vaginally every 3 (three) months. 1 each 4  . fexofenadine (ALLEGRA) 180 MG tablet Take 180 mg by mouth as needed.    Marland Kitchen levothyroxine (SYNTHROID, LEVOTHROID) 125 MCG tablet Take 125 mcg by mouth daily before breakfast. Once a week  takes 1/2 pill    . Multiple Vitamins-Minerals (MULTIVITAMIN PO) Take by mouth daily.    . Probiotic Product (PROBIOTIC PO) Take by mouth daily.     No current facility-administered medications for this visit.     Family History  Problem Relation Age of Onset  . Parkinson's disease Brother     ROS:  Pertinent items are noted in HPI.  Otherwise, a comprehensive ROS was negative.  Exam:   BP 128/86 (BP Location: Right Arm, Patient Position: Sitting, Cuff Size: Normal)   Pulse 64   Resp 16   Ht 5' 5.25" (1.657 m)   Wt 182 lb (82.6 kg)   LMP 12/03/2010   BMI 30.05 kg/m      Height: 5' 5.25" (165.7 cm)  Ht Readings from Last 3 Encounters:  10/01/16 5' 5.25" (1.657 m)  09/05/16 5' 5.5" (1.664 m)  08/10/15 5' 5.5" (1.664 m)    General appearance: alert, cooperative and appears stated age Head: Normocephalic, without obvious abnormality, atraumatic Neck: no adenopathy, supple, symmetrical, trachea midline and thyroid normal to inspection and palpation Lungs: clear to auscultation bilaterally Breasts: normal appearance, no masses or tenderness Heart: regular rate and rhythm Abdomen: soft, non-tender; bowel sounds normal; no masses,  no  organomegaly Extremities: extremities normal, atraumatic, no cyanosis or edema Skin: Skin color, texture, turgor normal. No rashes or lesions Lymph nodes: Cervical, supraclavicular, and axillary nodes normal. No abnormal inguinal nodes palpated Neurologic: Grossly normal   Pelvic: External genitalia:  no lesions              Urethra:  normal appearing urethra with no masses, tenderness or lesions              Bartholins and Skenes: normal                 Vagina: normal appearing vagina with normal color and discharge, no lesions              Cervix: no lesions              Pap taken: Yes.   Bimanual Exam:  Uterus:  normal size, contour, position, consistency, mobility, non-tender              Adnexa: normal adnexa and no mass, fullness,  tenderness               Rectovaginal: Confirms               Anus:  normal sphincter tone, no lesions  Chaperone was present for exam.  A:  Well Woman with normal exam PMP, no HRT Remote hx of abnormal pap smear, 20 years ago Hypothyroidism H/o atypical migraines Vaginal atrophy, uses Estring for this H/o excision of condyloma Resolution of abnormal urine odor  P:   Mammogram UTD.  Grade a breast density.  Not doing 3D pap smear and HR HPV obtained today RF for Estring 2mg  q 3 months.  #1/4RF Rx for shingles vaccination give Labs yearly with Dr. Brigitte Pulse Urine culture pending Return 1 year or prn new problems

## 2016-10-03 ENCOUNTER — Encounter: Payer: Self-pay | Admitting: Obstetrics & Gynecology

## 2016-10-03 LAB — URINE CULTURE

## 2016-10-04 ENCOUNTER — Encounter: Payer: Self-pay | Admitting: Obstetrics & Gynecology

## 2016-10-04 LAB — CYTOLOGY - PAP
Diagnosis: NEGATIVE
Diagnosis: REACTIVE
HPV (WINDOPATH): NOT DETECTED

## 2016-10-18 DIAGNOSIS — M5414 Radiculopathy, thoracic region: Secondary | ICD-10-CM | POA: Diagnosis not present

## 2016-10-18 DIAGNOSIS — M791 Myalgia: Secondary | ICD-10-CM | POA: Diagnosis not present

## 2016-10-18 DIAGNOSIS — M9908 Segmental and somatic dysfunction of rib cage: Secondary | ICD-10-CM | POA: Diagnosis not present

## 2016-10-18 DIAGNOSIS — M9902 Segmental and somatic dysfunction of thoracic region: Secondary | ICD-10-CM | POA: Diagnosis not present

## 2016-11-15 ENCOUNTER — Ambulatory Visit: Payer: BLUE CROSS/BLUE SHIELD | Admitting: Obstetrics & Gynecology

## 2016-11-27 DIAGNOSIS — M5414 Radiculopathy, thoracic region: Secondary | ICD-10-CM | POA: Diagnosis not present

## 2016-11-27 DIAGNOSIS — M9902 Segmental and somatic dysfunction of thoracic region: Secondary | ICD-10-CM | POA: Diagnosis not present

## 2016-11-27 DIAGNOSIS — M791 Myalgia: Secondary | ICD-10-CM | POA: Diagnosis not present

## 2016-11-27 DIAGNOSIS — M9908 Segmental and somatic dysfunction of rib cage: Secondary | ICD-10-CM | POA: Diagnosis not present

## 2016-11-29 DIAGNOSIS — E042 Nontoxic multinodular goiter: Secondary | ICD-10-CM | POA: Diagnosis not present

## 2016-12-05 DIAGNOSIS — Z23 Encounter for immunization: Secondary | ICD-10-CM | POA: Diagnosis not present

## 2016-12-06 ENCOUNTER — Other Ambulatory Visit: Payer: Self-pay | Admitting: Obstetrics & Gynecology

## 2016-12-06 ENCOUNTER — Encounter: Payer: Self-pay | Admitting: Obstetrics & Gynecology

## 2016-12-06 MED ORDER — METRONIDAZOLE 0.75 % VA GEL: 1.0000 | Freq: Every day | VAGINAL | 0 refills | Status: DC

## 2016-12-14 ENCOUNTER — Encounter: Payer: Self-pay | Admitting: Obstetrics & Gynecology

## 2017-01-28 DIAGNOSIS — R0789 Other chest pain: Secondary | ICD-10-CM | POA: Diagnosis not present

## 2017-01-28 DIAGNOSIS — R0602 Shortness of breath: Secondary | ICD-10-CM | POA: Diagnosis not present

## 2017-01-28 DIAGNOSIS — R0609 Other forms of dyspnea: Secondary | ICD-10-CM | POA: Diagnosis not present

## 2017-01-28 DIAGNOSIS — R05 Cough: Secondary | ICD-10-CM | POA: Diagnosis not present

## 2017-01-30 ENCOUNTER — Other Ambulatory Visit: Payer: Self-pay | Admitting: Internal Medicine

## 2017-01-30 DIAGNOSIS — Z1231 Encounter for screening mammogram for malignant neoplasm of breast: Secondary | ICD-10-CM

## 2017-02-26 DIAGNOSIS — J0140 Acute pansinusitis, unspecified: Secondary | ICD-10-CM | POA: Diagnosis not present

## 2017-04-15 ENCOUNTER — Ambulatory Visit
Admission: RE | Admit: 2017-04-15 | Discharge: 2017-04-15 | Disposition: A | Discharge status: Home / Self Care | Payer: BLUE CROSS/BLUE SHIELD | Source: Ambulatory Visit | Attending: Internal Medicine | Admitting: Internal Medicine

## 2017-04-15 ENCOUNTER — Other Ambulatory Visit: Payer: Self-pay | Admitting: Internal Medicine

## 2017-04-15 DIAGNOSIS — Z1231 Encounter for screening mammogram for malignant neoplasm of breast: Secondary | ICD-10-CM

## 2017-05-14 DIAGNOSIS — M9901 Segmental and somatic dysfunction of cervical region: Secondary | ICD-10-CM | POA: Diagnosis not present

## 2017-05-14 DIAGNOSIS — M79652 Pain in left thigh: Secondary | ICD-10-CM | POA: Diagnosis not present

## 2017-05-14 DIAGNOSIS — M545 Low back pain: Secondary | ICD-10-CM | POA: Diagnosis not present

## 2017-05-14 DIAGNOSIS — M62830 Muscle spasm of back: Secondary | ICD-10-CM | POA: Diagnosis not present

## 2017-07-04 DIAGNOSIS — M9908 Segmental and somatic dysfunction of rib cage: Secondary | ICD-10-CM | POA: Diagnosis not present

## 2017-07-04 DIAGNOSIS — M5414 Radiculopathy, thoracic region: Secondary | ICD-10-CM | POA: Diagnosis not present

## 2017-07-04 DIAGNOSIS — M9902 Segmental and somatic dysfunction of thoracic region: Secondary | ICD-10-CM | POA: Diagnosis not present

## 2017-07-04 DIAGNOSIS — M791 Myalgia, unspecified site: Secondary | ICD-10-CM | POA: Diagnosis not present

## 2017-08-13 DIAGNOSIS — M859 Disorder of bone density and structure, unspecified: Secondary | ICD-10-CM | POA: Diagnosis not present

## 2017-08-13 DIAGNOSIS — E042 Nontoxic multinodular goiter: Secondary | ICD-10-CM | POA: Diagnosis not present

## 2017-08-20 DIAGNOSIS — Z1389 Encounter for screening for other disorder: Secondary | ICD-10-CM | POA: Diagnosis not present

## 2017-08-20 DIAGNOSIS — L570 Actinic keratosis: Secondary | ICD-10-CM | POA: Diagnosis not present

## 2017-08-20 DIAGNOSIS — L821 Other seborrheic keratosis: Secondary | ICD-10-CM | POA: Diagnosis not present

## 2017-08-20 DIAGNOSIS — D1801 Hemangioma of skin and subcutaneous tissue: Secondary | ICD-10-CM | POA: Diagnosis not present

## 2017-08-20 DIAGNOSIS — E7849 Other hyperlipidemia: Secondary | ICD-10-CM | POA: Diagnosis not present

## 2017-08-20 DIAGNOSIS — M859 Disorder of bone density and structure, unspecified: Secondary | ICD-10-CM | POA: Diagnosis not present

## 2017-08-20 DIAGNOSIS — R030 Elevated blood-pressure reading, without diagnosis of hypertension: Secondary | ICD-10-CM | POA: Diagnosis not present

## 2017-08-20 DIAGNOSIS — L814 Other melanin hyperpigmentation: Secondary | ICD-10-CM | POA: Diagnosis not present

## 2017-08-20 DIAGNOSIS — Z85828 Personal history of other malignant neoplasm of skin: Secondary | ICD-10-CM | POA: Diagnosis not present

## 2017-08-20 DIAGNOSIS — E042 Nontoxic multinodular goiter: Secondary | ICD-10-CM | POA: Diagnosis not present

## 2017-08-20 DIAGNOSIS — Z0000 Encounter for general adult medical examination without abnormal findings: Secondary | ICD-10-CM | POA: Diagnosis not present

## 2017-09-01 DIAGNOSIS — M79672 Pain in left foot: Secondary | ICD-10-CM | POA: Diagnosis not present

## 2017-09-01 DIAGNOSIS — M79671 Pain in right foot: Secondary | ICD-10-CM | POA: Diagnosis not present

## 2017-09-19 DIAGNOSIS — M79672 Pain in left foot: Secondary | ICD-10-CM | POA: Diagnosis not present

## 2017-09-19 DIAGNOSIS — M79671 Pain in right foot: Secondary | ICD-10-CM | POA: Diagnosis not present

## 2017-10-10 DIAGNOSIS — M9908 Segmental and somatic dysfunction of rib cage: Secondary | ICD-10-CM | POA: Diagnosis not present

## 2017-10-10 DIAGNOSIS — M791 Myalgia, unspecified site: Secondary | ICD-10-CM | POA: Diagnosis not present

## 2017-10-10 DIAGNOSIS — M5414 Radiculopathy, thoracic region: Secondary | ICD-10-CM | POA: Diagnosis not present

## 2017-10-10 DIAGNOSIS — M9902 Segmental and somatic dysfunction of thoracic region: Secondary | ICD-10-CM | POA: Diagnosis not present

## 2017-10-24 ENCOUNTER — Ambulatory Visit: Payer: BLUE CROSS/BLUE SHIELD | Admitting: Obstetrics & Gynecology

## 2017-10-24 ENCOUNTER — Encounter: Payer: Self-pay | Admitting: Obstetrics & Gynecology

## 2017-10-24 ENCOUNTER — Other Ambulatory Visit: Payer: Self-pay

## 2017-10-24 VITALS — BP 104/70 | HR 96 | Resp 18 | Ht 65.5 in | Wt 176.8 lb

## 2017-10-24 DIAGNOSIS — Z01419 Encounter for gynecological examination (general) (routine) without abnormal findings: Secondary | ICD-10-CM

## 2017-10-24 MED ORDER — ESTRADIOL 2 MG VA RING: 2.0000 mg | VAGINAL_RING | VAGINAL | 4 refills | Status: DC

## 2017-10-24 NOTE — Progress Notes (Signed)
62 y.o. G0P0000 MarriedCaucasianF here for annual exam.  Doing well.  Denies vaginal bleeding except with intercourse.    PCP:  Dr. Brigitte Pulse.  Last visit was in May/June.  Did blood work then.   Patient's last menstrual period was 12/03/2010.          Sexually active: Yes.    The current method of family planning is post menopausal status.    Exercising: Yes.    walk 4-5 miles  Smoker:  no  Health Maintenance: Pap:  10/01/16 Neg. HR HPV:neg   07/19/14 Neg History of abnormal Pap:  Yes, remote hx MMG:  04/15/17 BIRADS1:Neg  Colonoscopy:  12/04/07 f/u 10 years.  Aware this is due after 12/03/17.  BMD:   2018 Normal  TDaP:  With PCP Pneumonia vaccine(s):  N/a Shingrix:  Completed  Hep C testing: 08/10/15 Neg  Screening Labs: PCP   reports that she has never smoked. She has never used smokeless tobacco. She reports that she drinks about 2.0 - 3.0 standard drinks of alcohol per week. She reports that she does not use drugs.  Past Medical History:  Diagnosis Date  . Abnormal Pap smear of cervix    chemicl tx, cone bx, and laser  . Goiter    multinodular goiter--negative bx   . Migraine   . Thyroid disease    hypothyroidism    Past Surgical History:  Procedure Laterality Date  . BLEPHAROPLASTY  2012      . CERVIX LESION DESTRUCTION  1990   chemical tx'd x2, cone bx and laser    Current Outpatient Medications  Medication Sig Dispense Refill  . Ascorbic Acid (VITAMIN C PO) Take by mouth daily.    Marland Kitchen BIOTIN PO Take 5,000 mcg by mouth daily.     . Calcium Carbonate-Vitamin D (CALCIUM + D PO) Take by mouth. 600mg  and minerals    . Cholecalciferol (VITAMIN D PO) Take by mouth daily.    . Cyanocobalamin (VITAMIN B 12 PO) Take by mouth daily.    . diphenhydrAMINE (BENADRYL) 25 MG tablet Take 25 mg by mouth at bedtime as needed.    Marland Kitchen estradiol (ESTRING) 2 MG vaginal ring Place 2 mg vaginally every 3 (three) months. 1 each 4  . fexofenadine (ALLEGRA) 180 MG tablet Take 180 mg by mouth as  needed.    Marland Kitchen levothyroxine (SYNTHROID, LEVOTHROID) 125 MCG tablet Take 125 mcg by mouth daily before breakfast. Once a week takes 1/2 pill    . Multiple Vitamins-Minerals (MULTIVITAMIN PO) Take by mouth daily.    . Probiotic Product (PROBIOTIC PO) Take by mouth daily.     No current facility-administered medications for this visit.     Family History  Problem Relation Age of Onset  . Parkinson's disease Brother     Review of Systems  Genitourinary:       Pain with intercourse   All other systems reviewed and are negative.   Exam:   BP 104/70 (BP Location: Right Arm, Patient Position: Sitting, Cuff Size: Large)   Pulse 96   Resp 18   Ht 5' 5.5" (1.664 m)   Wt 176 lb 12.8 oz (80.2 kg)   LMP 12/03/2010   BMI 28.97 kg/m    Height: 5' 5.5" (166.4 cm)  Ht Readings from Last 3 Encounters:  10/24/17 5' 5.5" (1.664 m)  10/01/16 5' 5.25" (1.657 m)  09/05/16 5' 5.5" (1.664 m)    General appearance: alert, cooperative and appears stated age Head: Normocephalic, without obvious  abnormality, atraumatic Neck: no adenopathy, supple, symmetrical, trachea midline and thyroid normal to inspection and palpation Lungs: clear to auscultation bilaterally Breasts: normal appearance, no masses or tenderness Heart: regular rate and rhythm Abdomen: soft, non-tender; bowel sounds normal; no masses,  no organomegaly Extremities: extremities normal, atraumatic, no cyanosis or edema Skin: Skin color, texture, turgor normal. No rashes or lesions Lymph nodes: Cervical, supraclavicular, and axillary nodes normal. No abnormal inguinal nodes palpated Neurologic: Grossly normal   Pelvic: External genitalia:  no lesions              Urethra:  normal appearing urethra with no masses, tenderness or lesions              Bartholins and Skenes: normal                 Vagina: vaginal atrophic changes, no lesions              Cervix: no lesions              Pap taken: No. Bimanual Exam:  Uterus:  normal  size, contour, position, consistency, mobility, non-tender              Adnexa: normal adnexa and no mass, fullness, tenderness               Rectovaginal: Confirms               Anus:  normal sphincter tone, no lesions  Chaperone was present for exam.  A:  Well Woman with normal exam PMP, no HRT Hx of abnormal pap smear >20 years ago Hypothyroidism Significant vaginal atrophy, using Estring H/O condyloma, s/p excision  P:   Mammogram guidelines reviewed. pap smear with neg HR HPV 2018.  No pap indicated today. Aware colonoscopy due after 12/03/17 RX for Estring 2mg  pv q 3 months.  #1/4RF Pt is also going to try vit E vaginal suppositories due to significant atrophic changes Lab work and vaccines done with Dr. Brigitte Pulse return annually or prn

## 2017-10-24 NOTE — Patient Instructions (Signed)
Key E vaginal Vit suppositories.  Twice weekly.  Make by [facility].

## 2017-10-30 ENCOUNTER — Telehealth: Payer: Self-pay | Admitting: Obstetrics & Gynecology

## 2017-10-30 ENCOUNTER — Encounter: Payer: Self-pay | Admitting: Obstetrics & Gynecology

## 2017-10-30 NOTE — Telephone Encounter (Signed)
Left message to call Stephaine Breshears at [phone].  

## 2017-10-30 NOTE — Telephone Encounter (Signed)
Patient sent the following correspondence through [place]. Routing to triage to assist patient with request.  Good morning! I inserted my 2nd Key-E suppository last evening and today I am having INTENSE burning when I urinate. I am going to stop using them but I didnt know if this will resolve itself or if there is something I should get to help it resolve. Thank you, Olivia Ellison

## 2017-10-30 NOTE — Telephone Encounter (Signed)
Spoke with patient. Placed Vit E vag suppository Sunday night and Wed night, reports burning with urination. Skin burns when urine touches skin, "feels red hot". Denies any other urinary symptoms, lower back pain or fever/chills.   Advised patient can take several wks for Vit E vaginal suppositories to be effective. Vit D is rehydrating vaginal tissue, this takes time, as you use the Vit E this should improve.  Patient states she does not think she can place another suppository, would prefer to go back to using coconut oil and the Estring. Patient requesting additional recommendations or alternative.    Advised Dr. Sabra Heck will review, I will return call with recommendations.   Dr. Sabra Heck -please advise.

## 2017-10-31 NOTE — Telephone Encounter (Signed)
She may be sensitive to a component in the [place] to go back to [employer] and coconut oil.  I was trying to see if anything would be less costly for her but she's done well with the Estring and coconut oil.  Has rx so does not need a new one.

## 2017-10-31 NOTE — Telephone Encounter (Signed)
Spoke with patient, advised as seen below per Dr. Miller. Patient verbalizes understanding and is agreeable.   Encounter closed.  

## 2017-11-24 DIAGNOSIS — M9908 Segmental and somatic dysfunction of rib cage: Secondary | ICD-10-CM | POA: Diagnosis not present

## 2017-11-24 DIAGNOSIS — M5414 Radiculopathy, thoracic region: Secondary | ICD-10-CM | POA: Diagnosis not present

## 2017-11-24 DIAGNOSIS — M9902 Segmental and somatic dysfunction of thoracic region: Secondary | ICD-10-CM | POA: Diagnosis not present

## 2017-11-24 DIAGNOSIS — M791 Myalgia, unspecified site: Secondary | ICD-10-CM | POA: Diagnosis not present

## 2017-12-04 ENCOUNTER — Ambulatory Visit: Payer: BLUE CROSS/BLUE SHIELD | Admitting: Obstetrics & Gynecology

## 2017-12-29 ENCOUNTER — Encounter: Payer: Self-pay | Admitting: Gastroenterology

## 2018-02-03 ENCOUNTER — Telehealth: Payer: Self-pay | Admitting: Obstetrics & Gynecology

## 2018-02-03 ENCOUNTER — Encounter: Payer: Self-pay | Admitting: Obstetrics & Gynecology

## 2018-02-03 DIAGNOSIS — M9901 Segmental and somatic dysfunction of cervical region: Secondary | ICD-10-CM | POA: Diagnosis not present

## 2018-02-03 DIAGNOSIS — M79652 Pain in left thigh: Secondary | ICD-10-CM | POA: Diagnosis not present

## 2018-02-03 DIAGNOSIS — M545 Low back pain: Secondary | ICD-10-CM | POA: Diagnosis not present

## 2018-02-03 DIAGNOSIS — M62830 Muscle spasm of back: Secondary | ICD-10-CM | POA: Diagnosis not present

## 2018-02-03 NOTE — Telephone Encounter (Signed)
Patient sent the following correspondence through [place]. Routing to triage to assist patient with request.  Hi. I am having been dealing with an issue for a few weeks. Not a UTI but have irritation/itching/smell in vaginal area. Seems like a fishy smell. Is there something I can get to clear this up either OTC or Rx? Thank you.  Olivia Ellison

## 2018-02-03 NOTE — Telephone Encounter (Signed)
Spoke with patient. Reports fishy vaginal odor, yellow/green vaginal d/c, irritation and itching. Started 2-3 wks ago. Denies urinary symptoms, N/V, fever/chills, vaginal bleeding. Asking for treatment recommendations.   Advised patient OV needed for further evaluation, patient states she is out of town this week. OV scheduled for 12/10 at 4:15pm with Dr. Sabra Heck.   Routing to provider for final review. Patient is agreeable to disposition. Will close encounter.

## 2018-02-10 ENCOUNTER — Ambulatory Visit (INDEPENDENT_AMBULATORY_CARE_PROVIDER_SITE_OTHER): Payer: BLUE CROSS/BLUE SHIELD | Admitting: Obstetrics & Gynecology

## 2018-02-10 ENCOUNTER — Encounter: Payer: Self-pay | Admitting: Obstetrics & Gynecology

## 2018-02-10 ENCOUNTER — Ambulatory Visit: Payer: Self-pay | Admitting: Obstetrics & Gynecology

## 2018-02-10 ENCOUNTER — Other Ambulatory Visit: Payer: Self-pay

## 2018-02-10 VITALS — BP 112/78 | HR 80 | Resp 16 | Ht 65.5 in | Wt 178.4 lb

## 2018-02-10 DIAGNOSIS — N898 Other specified noninflammatory disorders of vagina: Secondary | ICD-10-CM

## 2018-02-10 NOTE — Addendum Note (Signed)
Addended by: Polly Cobia on: 02/10/2018 02:38 PM   Modules accepted: Orders

## 2018-02-10 NOTE — Progress Notes (Signed)
GYNECOLOGY  VISIT  CC:   Vaginal odor   HPI: 62 y.o. G0P0000 Married White or Caucasian female here for vaginitis symptoms.  Having discharge.  Has noted a little spotting as well.  She is having vaginal odor as well.  Denies urinary symptoms.  No pelvic pain.  No fever.    GYNECOLOGIC HISTORY: Patient's last menstrual period was 12/03/2010. Contraception: PMP Menopausal hormone therapy: none  Patient Active Problem List   Diagnosis Date Noted  . Atrophic vaginitis 02/03/2015  . Unspecified hypothyroidism 07/13/2013    Past Medical History:  Diagnosis Date  . Abnormal Pap smear of cervix    chemicl tx, cone bx, and laser  . Goiter    multinodular goiter--negative bx   . Migraine   . Thyroid disease    hypothyroidism    Past Surgical History:  Procedure Laterality Date  . BLEPHAROPLASTY  2012      . CERVIX LESION DESTRUCTION  1990   chemical tx'd x2, cone bx and laser    MEDS:   Current Outpatient Medications on File Prior to Visit  Medication Sig Dispense Refill  . Ascorbic Acid (VITAMIN C PO) Take by mouth daily.    Marland Kitchen BIOTIN PO Take 5,000 mcg by mouth daily.     . Calcium Carbonate-Vitamin D (CALCIUM + D PO) Take by mouth. 600mg  and minerals    . Cholecalciferol (VITAMIN D PO) Take by mouth daily.    . Cyanocobalamin (VITAMIN B 12 PO) Take by mouth daily.    Marland Kitchen estradiol (ESTRING) 2 MG vaginal ring Place 2 mg vaginally every 3 (three) months. 1 each 4  . fexofenadine (ALLEGRA) 180 MG tablet Take 180 mg by mouth as needed.    Marland Kitchen levothyroxine (SYNTHROID, LEVOTHROID) 125 MCG tablet Take 125 mcg by mouth daily before breakfast. Once a week takes 1/2 pill    . Multiple Vitamins-Minerals (MULTIVITAMIN PO) Take by mouth daily.    . Probiotic Product (PROBIOTIC PO) Take by mouth daily.     No current facility-administered medications on file prior to visit.     ALLERGIES: Osphena [ospemifene] and Other  Family History  Problem Relation Age of Onset  . Parkinson's  disease Brother     SH:  Married, non smoker  Review of Systems  Genitourinary: Positive for dyspareunia, vaginal bleeding and vaginal discharge.       Vaginal itching  All other systems reviewed and are negative.   PHYSICAL EXAMINATION:    BP 112/78 (BP Location: Right Arm, Patient Position: Sitting, Cuff Size: Large)   Pulse 80   Resp 16   Ht 5' 5.5" (1.664 m)   Wt 178 lb 6.4 oz (80.9 kg)   LMP 12/03/2010   BMI 29.24 kg/m     General appearance: alert, cooperative and appears stated age Lymph:  no inguinal LAD noted  Pelvic: External genitalia:  no lesions              Urethra:  normal appearing urethra with no masses, tenderness or lesions              Bartholins and Skenes: normal                 Vagina: atrophic changes, no masses or lesions, estring is in place              Cervix: no lesions, no blood at os noted              Bimanual Exam:  Uterus:  normal size, contour, position, consistency, mobility, non-tender              Adnexa: no mass, fullness, tenderness  Chaperone was present for exam.  Assessment: Vaginal discharge Vaginal odor Vaginal atrophic changes  Plan: Affirm pending and urine culture pending Using estring and Vit e but hasn't used the Vit E in about a month.  May need to make changes for improved vaginal tissue

## 2018-02-11 LAB — VAGINITIS/VAGINOSIS, DNA PROBE
CANDIDA SPECIES: NEGATIVE
GARDNERELLA VAGINALIS: POSITIVE — AB
Trichomonas vaginosis: NEGATIVE

## 2018-02-12 ENCOUNTER — Other Ambulatory Visit: Payer: Self-pay | Admitting: *Deleted

## 2018-02-12 DIAGNOSIS — M79672 Pain in left foot: Secondary | ICD-10-CM | POA: Diagnosis not present

## 2018-02-12 DIAGNOSIS — M79671 Pain in right foot: Secondary | ICD-10-CM | POA: Diagnosis not present

## 2018-02-12 LAB — URINE CULTURE: ORGANISM ID, BACTERIA: NO GROWTH

## 2018-02-12 MED ORDER — METRONIDAZOLE 0.75 % VA GEL: 1.0000 | Freq: Every day | VAGINAL | 0 refills | Status: AC

## 2018-02-19 ENCOUNTER — Telehealth: Payer: Self-pay | Admitting: Obstetrics & Gynecology

## 2018-02-19 ENCOUNTER — Encounter: Payer: Self-pay | Admitting: Obstetrics & Gynecology

## 2018-02-19 NOTE — Telephone Encounter (Signed)
Call to patient. Patient states she is still calling around to see where she can be seen. RN advised of Dr. Ammie Ferrier recommendations and patient verbalized understanding.   Encounter closed.

## 2018-02-19 NOTE — Telephone Encounter (Signed)
Call to patient. Patient states that she is currently having a foul urinary odor. Denies fever or pain with urination. States she completed the metrogel treatment and all vaginal symptoms have resolved. Patient states she will be in [place] until May. RN advised patient should be seen at Urgent Care near her to have her symptoms evaluated. Patient states she has had this odor before and Dr. Sabra Heck prescribed her an antibiotic. RN advised UC was negative when patient was here 02-10-18 and bactrim was last sent in 09-2016. Patient verbalized understanding. RN advised would review with Dr. Sabra Heck and return call with any additional recommendations. Patient agreeable.   Routing to provider.

## 2018-02-19 NOTE — Telephone Encounter (Signed)
I do think she needs a urine culture so I think she should do this locally.  Agree with recommendations given.  Thanks.

## 2018-02-19 NOTE — Telephone Encounter (Signed)
Patient sent the following correspondence through [place]. Routing to triage to assist patient with request.  Hi Dr Sabra Heck. As you know we are now back in [place]. The Rx you gave me has cleared up the vaginal issue I was having. However now I am having that awful urine smell again. I drink a ton of water each day and it is still awful. Is there anyway I could get another Rx for this? I know you did a urine check when I was in but not sure what is going on. Thank you, Olivia Ellison

## 2018-02-26 DIAGNOSIS — N390 Urinary tract infection, site not specified: Secondary | ICD-10-CM | POA: Diagnosis not present

## 2018-03-04 HISTORY — PX: COLONOSCOPY: SHX174

## 2018-05-11 DIAGNOSIS — M9902 Segmental and somatic dysfunction of thoracic region: Secondary | ICD-10-CM | POA: Diagnosis not present

## 2018-05-11 DIAGNOSIS — M791 Myalgia, unspecified site: Secondary | ICD-10-CM | POA: Diagnosis not present

## 2018-05-11 DIAGNOSIS — M9908 Segmental and somatic dysfunction of rib cage: Secondary | ICD-10-CM | POA: Diagnosis not present

## 2018-05-11 DIAGNOSIS — M5414 Radiculopathy, thoracic region: Secondary | ICD-10-CM | POA: Diagnosis not present

## 2018-08-19 ENCOUNTER — Telehealth: Payer: Self-pay | Admitting: *Deleted

## 2018-08-19 DIAGNOSIS — M859 Disorder of bone density and structure, unspecified: Secondary | ICD-10-CM | POA: Diagnosis not present

## 2018-08-19 DIAGNOSIS — E042 Nontoxic multinodular goiter: Secondary | ICD-10-CM | POA: Diagnosis not present

## 2018-08-19 DIAGNOSIS — Z0000 Encounter for general adult medical examination without abnormal findings: Secondary | ICD-10-CM | POA: Diagnosis not present

## 2018-08-19 DIAGNOSIS — E7849 Other hyperlipidemia: Secondary | ICD-10-CM | POA: Diagnosis not present

## 2018-08-19 NOTE — Telephone Encounter (Signed)
Spoke with patient. Reports yellow/gray vaginal d/c with odor and vaginal itching that started 1 wk ago. Denies vaginal bleeding, pain, fever/chills, or urinary symptoms. Advised OV needed for further evaluation, OV scheduled for 6/22 at 4:30pm with Dr. Sabra Heck. Patient declines OV today with covering provider. Covid 19 precautions reviewed.   Routing to provider for final review. Patient is agreeable to disposition. Will close encounter.

## 2018-08-19 NOTE — Telephone Encounter (Signed)
Patient is having a discharge, itchiness and odor.

## 2018-08-20 ENCOUNTER — Other Ambulatory Visit: Payer: Self-pay

## 2018-08-20 DIAGNOSIS — M9902 Segmental and somatic dysfunction of thoracic region: Secondary | ICD-10-CM | POA: Diagnosis not present

## 2018-08-20 DIAGNOSIS — M5414 Radiculopathy, thoracic region: Secondary | ICD-10-CM | POA: Diagnosis not present

## 2018-08-20 DIAGNOSIS — M791 Myalgia, unspecified site: Secondary | ICD-10-CM | POA: Diagnosis not present

## 2018-08-20 DIAGNOSIS — M9908 Segmental and somatic dysfunction of rib cage: Secondary | ICD-10-CM | POA: Diagnosis not present

## 2018-08-21 ENCOUNTER — Other Ambulatory Visit: Payer: Self-pay

## 2018-08-21 ENCOUNTER — Ambulatory Visit: Payer: BC Managed Care – PPO | Admitting: *Deleted

## 2018-08-21 VITALS — Ht 66.0 in | Wt 173.0 lb

## 2018-08-21 DIAGNOSIS — Z1211 Encounter for screening for malignant neoplasm of colon: Secondary | ICD-10-CM

## 2018-08-21 MED ORDER — SUPREP BOWEL PREP KIT 17.5-3.13-1.6 GM/177ML PO SOLN: 1.0000 | Freq: Once | ORAL | 0 refills | Status: AC

## 2018-08-21 NOTE — Progress Notes (Signed)
No egg or soy allergy known to patient  No issues with past sedation with any surgeries  or procedures, no intubation problems  No diet pills per patient No home 02 use per patient  No blood thinners per patient  Pt denies issues with constipation  No A fib or A flutter  EMMI video sent to pt's e mail   Pt verified name, DOB, address and insurance during PV today. Pt mailed instruction packet to included paper to complete and mail back to [facility] with addressed and stamped envelope, Emmi video, copy of consent form to read and not return, and instructions. [employer] $15  coupon mailed in packet. PV completed over the phone. Pt encouraged to call with questions or issues   Pt is aware that care partner will wait in the car during parking lot; if they feel like they will be too hot to wait in the car; they may wait in the lobby.  We want them to wear a mask (we do not have any that we can provide them), practice social distancing, and we will check their temperatures when they get here.  I did remind patient that their care partner needs to stay in the parking lot the entire time. Pt will wear mask into building

## 2018-08-24 ENCOUNTER — Ambulatory Visit: Payer: BC Managed Care – PPO | Admitting: Obstetrics & Gynecology

## 2018-08-24 DIAGNOSIS — C44519 Basal cell carcinoma of skin of other part of trunk: Secondary | ICD-10-CM | POA: Diagnosis not present

## 2018-08-24 DIAGNOSIS — L814 Other melanin hyperpigmentation: Secondary | ICD-10-CM | POA: Diagnosis not present

## 2018-08-24 DIAGNOSIS — D220 Melanocytic nevi of lip: Secondary | ICD-10-CM | POA: Diagnosis not present

## 2018-08-24 DIAGNOSIS — D1801 Hemangioma of skin and subcutaneous tissue: Secondary | ICD-10-CM | POA: Diagnosis not present

## 2018-08-24 DIAGNOSIS — L570 Actinic keratosis: Secondary | ICD-10-CM | POA: Diagnosis not present

## 2018-08-24 DIAGNOSIS — Z85828 Personal history of other malignant neoplasm of skin: Secondary | ICD-10-CM | POA: Diagnosis not present

## 2018-08-24 DIAGNOSIS — C44712 Basal cell carcinoma of skin of right lower limb, including hip: Secondary | ICD-10-CM | POA: Diagnosis not present

## 2018-08-25 ENCOUNTER — Other Ambulatory Visit: Payer: Self-pay

## 2018-08-25 ENCOUNTER — Ambulatory Visit: Payer: BC Managed Care – PPO | Admitting: Obstetrics & Gynecology

## 2018-08-25 ENCOUNTER — Encounter: Payer: Self-pay | Admitting: Obstetrics & Gynecology

## 2018-08-25 VITALS — BP 130/84 | HR 72 | Temp 97.5°F | Resp 14 | Ht 66.0 in | Wt 178.6 lb

## 2018-08-25 DIAGNOSIS — M5414 Radiculopathy, thoracic region: Secondary | ICD-10-CM | POA: Diagnosis not present

## 2018-08-25 DIAGNOSIS — M9908 Segmental and somatic dysfunction of rib cage: Secondary | ICD-10-CM | POA: Diagnosis not present

## 2018-08-25 DIAGNOSIS — M791 Myalgia, unspecified site: Secondary | ICD-10-CM | POA: Diagnosis not present

## 2018-08-25 DIAGNOSIS — M9902 Segmental and somatic dysfunction of thoracic region: Secondary | ICD-10-CM | POA: Diagnosis not present

## 2018-08-25 DIAGNOSIS — N898 Other specified noninflammatory disorders of vagina: Secondary | ICD-10-CM

## 2018-08-25 NOTE — Progress Notes (Signed)
GYNECOLOGY  VISIT  CC:   Vaginitis symptoms   HPI: 63 y.o. G0P0000 Married White or Caucasian female here for vaginal itching and discharge.  Denies vaginal bleeding.  Denies dysuria or urine odor.  Has not tried anything OTC.  She is using estring for vaginal atrophic changes.  Last change was beginning of May.  Has not tried any OTC products.  GYNECOLOGIC HISTORY: Patient's last menstrual period was 12/03/2010. Contraception:  PMP Menopausal hormone therapy: estring   Patient Active Problem List   Diagnosis Date Noted  . Atrophic vaginitis 02/03/2015  . Unspecified hypothyroidism 07/13/2013    Past Medical History:  Diagnosis Date  . Abnormal Pap smear of cervix    chemicl tx, cone bx, and laser  . Allergy   . Goiter    multinodular goiter--negative bx   . Migraine   . Thyroid disease    hypothyroidism    Past Surgical History:  Procedure Laterality Date  . BLEPHAROPLASTY  2012      . CERVIX LESION DESTRUCTION  1990   chemical tx'd x2, cone bx and laser  . COLONOSCOPY      MEDS:   Current Outpatient Medications on File Prior to Visit  Medication Sig Dispense Refill  . albuterol (VENTOLIN HFA) 108 (90 Base) MCG/ACT inhaler INHALE 2 PUFFS EVERY 6 HOURS AS NEEDED FOR COUGH OR WHEEZE    . Ascorbic Acid (VITAMIN C PO) Take by mouth daily.    Marland Kitchen BIOTIN PO Take 5,000 mcg by mouth daily.     . Calcium Carbonate-Vitamin D (CALCIUM + D PO) Take by mouth. 600mg  and minerals    . Cholecalciferol (VITAMIN D PO) Take by mouth daily.    . Cyanocobalamin (VITAMIN B 12 PO) Take by mouth daily.    Marland Kitchen estradiol (ESTRING) 2 MG vaginal ring Place 2 mg vaginally every 3 (three) months. 1 each 4  . fexofenadine (ALLEGRA) 180 MG tablet Take 180 mg by mouth as needed.    Marland Kitchen levothyroxine (SYNTHROID, LEVOTHROID) 125 MCG tablet Take 125 mcg by mouth daily before breakfast. One whole tablet 6 days a week and Once a week takes 1/2 pill    . Multiple Vitamins-Minerals (MULTIVITAMIN PO) Take by  mouth daily.    . Probiotic Product (PROBIOTIC PO) Take by mouth daily.    . valACYclovir (VALTREX) 500 MG tablet TAKE 1 CAPLET BY MOUTH TWICE DAILY AS NEEDED FOR OUTBREAK     No current facility-administered medications on file prior to visit.     ALLERGIES: Osphena [ospemifene] and Other  Family History  Problem Relation Age of Onset  . Parkinson's disease Brother   . Colon cancer Paternal Uncle   . Colon cancer Paternal Uncle   . Colon polyps Neg Hx   . Esophageal cancer Neg Hx   . Rectal cancer Neg Hx   . Stomach cancer Neg Hx     SH:  Married, non smoker  Review of Systems  All other systems reviewed and are negative.   PHYSICAL EXAMINATION:    LMP 12/03/2010     General appearance: alert, cooperative and appears stated age Lymph:  no inguinal LAD noted  Pelvic: External genitalia:  no lesions              Urethra:  normal appearing urethra with no masses, tenderness or lesions              Bartholins and Skenes: normal  Vagina: normal appearing vagina, atrophic changes noted, whitish discharge noted              Cervix: no lesions              Bimanual Exam:  Uterus:  normal size, contour, position, consistency, mobility, non-tender              Adnexa: no mass, fullness, tenderness  Chaperone was present for exam.  Assessment: Vaginal discharge and itching  Plan: Affirm pending.  Pt does not want to be treated today and will wait for result which should be finalized tomorrow.

## 2018-08-26 DIAGNOSIS — Z0000 Encounter for general adult medical examination without abnormal findings: Secondary | ICD-10-CM | POA: Diagnosis not present

## 2018-08-26 DIAGNOSIS — E785 Hyperlipidemia, unspecified: Secondary | ICD-10-CM | POA: Diagnosis not present

## 2018-08-26 DIAGNOSIS — R7301 Impaired fasting glucose: Secondary | ICD-10-CM | POA: Diagnosis not present

## 2018-08-26 DIAGNOSIS — Z1331 Encounter for screening for depression: Secondary | ICD-10-CM | POA: Diagnosis not present

## 2018-08-26 DIAGNOSIS — R030 Elevated blood-pressure reading, without diagnosis of hypertension: Secondary | ICD-10-CM | POA: Diagnosis not present

## 2018-08-26 DIAGNOSIS — M8580 Other specified disorders of bone density and structure, unspecified site: Secondary | ICD-10-CM | POA: Diagnosis not present

## 2018-08-26 LAB — VAGINITIS/VAGINOSIS, DNA PROBE
Candida Species: NEGATIVE
Gardnerella vaginalis: POSITIVE — AB
Trichomonas vaginosis: NEGATIVE

## 2018-08-27 ENCOUNTER — Other Ambulatory Visit: Payer: Self-pay | Admitting: *Deleted

## 2018-08-27 MED ORDER — METRONIDAZOLE 500 MG PO TABS: 500.0000 mg | ORAL_TABLET | Freq: Two times a day (BID) | ORAL | 0 refills | Status: AC

## 2018-08-30 ENCOUNTER — Encounter: Payer: Self-pay | Admitting: Obstetrics & Gynecology

## 2018-08-31 ENCOUNTER — Other Ambulatory Visit: Payer: Self-pay | Admitting: Internal Medicine

## 2018-08-31 ENCOUNTER — Telehealth: Payer: Self-pay | Admitting: Obstetrics & Gynecology

## 2018-08-31 DIAGNOSIS — E785 Hyperlipidemia, unspecified: Secondary | ICD-10-CM

## 2018-08-31 MED ORDER — METRONIDAZOLE 0.75 % VA GEL: 1.0000 | Freq: Every day | VAGINAL | 0 refills | Status: DC

## 2018-08-31 MED ORDER — ESTRING 2 MG VA RING: 2.0000 mg | VAGINAL_RING | VAGINAL | 4 refills | Status: DC

## 2018-08-31 NOTE — Telephone Encounter (Signed)
Spoke with patient. She has decided she rather have Metrogel instead of Flagyl tablets. Traveling and hasn't picked up Rx yet. Cancelled flagyl tablets and sent new Rx to pharmacy for Metrogel 3.790%, 1 applicatorful qhs x5 nights.   Patient also requesting written Rx for Estring because she is price checking ($85) local pharmacy. Wanted to let Dr.Miller now of [place] [facility] website she was talking to her about---[email or web address]. Advised would have Dr.Miller print/sign new Rx for Estring and we will mail to her. Routed to provider.

## 2018-08-31 NOTE — Telephone Encounter (Signed)
Patient sent the following correspondence through [place]. Routing to triage to assist patient with request.  Hi. When I got a call about my visit last week I was on the road in [place] and I requested the oral med. We are still traveling but I would prefer to have the vaginal insert, Metrogel I believe. Would it be possible to change this? You could still send it to the [facility]  on [address]. Thank you and sorry for the confusion.  Could I also get a paper copy of my Estring Rx? Was going to try a new pharmacy since I see it for $85 now.  The website I mentioned to you for [place] [facility] is: [email or web address]  I can be reached at [phone].  Thank you,  Olivia Ellison

## 2018-08-31 NOTE — Telephone Encounter (Signed)
Written Rx for Estring mailed to patient.

## 2018-08-31 NOTE — Telephone Encounter (Signed)
Rx for metrogel completed.  Printed esting rx completed as well.  Will have reina

## 2018-08-31 NOTE — Telephone Encounter (Signed)
Will have Ridott, CMA, bring rx to you.  Thanks.

## 2018-09-03 ENCOUNTER — Encounter: Payer: Self-pay | Admitting: Gastroenterology

## 2018-09-07 ENCOUNTER — Other Ambulatory Visit: Payer: Self-pay | Admitting: Internal Medicine

## 2018-09-07 DIAGNOSIS — Z1231 Encounter for screening mammogram for malignant neoplasm of breast: Secondary | ICD-10-CM

## 2018-09-11 ENCOUNTER — Telehealth: Payer: Self-pay | Admitting: Gastroenterology

## 2018-09-11 NOTE — Telephone Encounter (Signed)
Spoke with patient she answered no to all questions

## 2018-09-11 NOTE — Telephone Encounter (Signed)

## 2018-09-14 ENCOUNTER — Other Ambulatory Visit: Payer: Self-pay

## 2018-09-14 ENCOUNTER — Encounter: Payer: Self-pay | Admitting: Gastroenterology

## 2018-09-14 ENCOUNTER — Ambulatory Visit (AMBULATORY_SURGERY_CENTER): Payer: BC Managed Care – PPO | Admitting: Gastroenterology

## 2018-09-14 VITALS — BP 133/79 | HR 62 | Temp 98.3°F | Resp 20 | Ht 66.0 in | Wt 178.0 lb

## 2018-09-14 DIAGNOSIS — Z1211 Encounter for screening for malignant neoplasm of colon: Secondary | ICD-10-CM | POA: Diagnosis not present

## 2018-09-14 DIAGNOSIS — D120 Benign neoplasm of cecum: Secondary | ICD-10-CM

## 2018-09-14 DIAGNOSIS — K635 Polyp of colon: Secondary | ICD-10-CM

## 2018-09-14 DIAGNOSIS — D122 Benign neoplasm of ascending colon: Secondary | ICD-10-CM

## 2018-09-14 MED ORDER — SODIUM CHLORIDE 0.9 % IV SOLN: 500.0000 mL | Freq: Once | INTRAVENOUS | Status: DC

## 2018-09-14 NOTE — Progress Notes (Signed)
Called to room to assist during endoscopic procedure.  Patient ID and intended procedure confirmed with present staff. Received instructions for my participation in the procedure from the performing physician.  

## 2018-09-14 NOTE — Progress Notes (Signed)
Temp [place] Vitals JB 

## 2018-09-14 NOTE — Patient Instructions (Signed)
YOU HAD AN ENDOSCOPIC PROCEDURE TODAY AT THE [facility]:   Refer to the procedure report that was given to you for any specific questions about what was found during the examination.  If the procedure report does not answer your questions, please call your gastroenterologist to clarify.  If you requested that your care partner not be given the details of your procedure findings, then the procedure report has been included in a sealed envelope for you to review at your convenience later.  YOU SHOULD EXPECT: Some feelings of bloating in the abdomen. Passage of more gas than usual.  Walking can help get rid of the air that was put into your GI tract during the procedure and reduce the bloating. If you had a lower endoscopy (such as a colonoscopy or flexible sigmoidoscopy) you may notice spotting of blood in your stool or on the toilet paper. If you underwent a bowel prep for your procedure, you may not have a normal bowel movement for a few days.  Please Note:  You might notice some irritation and congestion in your nose or some drainage.  This is from the oxygen used during your procedure.  There is no need for concern and it should clear up in a day or so.  SYMPTOMS TO REPORT IMMEDIATELY:   Following lower endoscopy (colonoscopy or flexible sigmoidoscopy):  Excessive amounts of blood in the stool  Significant tenderness or worsening of abdominal pains  Swelling of the abdomen that is new, acute  Fever of 100F or higher   For urgent or emergent issues, a gastroenterologist can be reached at any hour by calling [phone].   DIET:  We do recommend a small meal at first, but then you may proceed to your regular diet.  Drink plenty of fluids but you should avoid alcoholic beverages for 24 hours.  MEDICATIONS: Continue present medications.  Please see handouts given to you by your recovery nurse.  ACTIVITY:  You should plan to take it easy for the rest of today and you should  NOT DRIVE or use heavy machinery until tomorrow (because of the sedation medicines used during the test).    FOLLOW UP: Our staff will call the number listed on your records 48-72 hours following your procedure to check on you and address any questions or concerns that you may have regarding the information given to you following your procedure. If we do not reach you, we will leave a message.  We will attempt to reach you two times.  During this call, we will ask if you have developed any symptoms of COVID 19. If you develop any symptoms (ie: fever, flu-like symptoms, shortness of breath, cough etc.) before then, please call [phone].  If you test positive for Covid 19 in the 2 weeks post procedure, please call and report this information to us.    If any biopsies were taken you will be contacted by phone or by letter within the next 1-3 weeks.  Please call us at [phone] if you have not heard about the biopsies in 3 weeks.   Thank you for allowing us to provide for your healthcare needs today.   SIGNATURES/CONFIDENTIALITY: You and/or your care partner have signed paperwork which will be entered into your electronic medical record.  These signatures attest to the fact that that the information above on your After Visit Summary has been reviewed and is understood.  Full responsibility of the confidentiality of this discharge information lies with you and/or   your care-partner. 

## 2018-09-14 NOTE — Op Note (Signed)
[facility] Patient Name: Olivia Ellison Procedure Date: 09/14/2018 1:24 PM MRN: [id number] Endoscopist: Ladene Artist , MD Age: 63 Referring MD:  Date of Birth: 01-01-1956 Gender: Female Account #: [id number] Procedure:                Colonoscopy Indications:              Screening for colorectal malignant neoplasm Medicines:                Monitored Anesthesia Care Procedure:                Pre-Anesthesia Assessment:                           - Prior to the procedure, a History and Physical                            was performed, and patient medications and                            allergies were reviewed. The patient's tolerance of                            previous anesthesia was also reviewed. The risks                            and benefits of the procedure and the sedation                            options and risks were discussed with the patient.                            All questions were answered, and informed consent                            was obtained. Prior Anticoagulants: The patient has                            taken no previous anticoagulant or antiplatelet                            agents. ASA Grade Assessment: II - A patient with                            mild systemic disease. After reviewing the risks                            and benefits, the patient was deemed in                            satisfactory condition to undergo the procedure.                           After obtaining informed consent, the colonoscope  was passed under direct vision. Throughout the                            procedure, the patient's blood pressure, pulse, and                            oxygen saturations were monitored continuously. The                            Model CF-HQ190L [id number]) scope was introduced                            through the anus and advanced to the the cecum,                            identified by  appendiceal orifice and ileocecal                            valve. The ileocecal valve, appendiceal orifice,                            and rectum were photographed. The quality of the                            bowel preparation was excellent. The colonoscopy                            was performed without difficulty. The patient                            tolerated the procedure well. Scope In: 1:39:35 PM Scope Out: 1:53:38 PM Scope Withdrawal Time: 0 hours 10 minutes 44 seconds  Total Procedure Duration: 0 hours 14 minutes 3 seconds  Findings:                 The perianal and digital rectal examinations were                            normal.                           Two sessile polyps were found in the ascending                            colon and cecum. The polyps were 7 to 8 mm in size.                            These polyps were removed with a cold snare.                            Resection and retrieval were complete.                           External and internal hemorrhoids were found during  retroflexion. The hemorrhoids were small and Grade                            I (internal hemorrhoids that do not prolapse).                           The exam was otherwise without abnormality on                            direct and retroflexion views. Complications:            No immediate complications. Estimated blood loss:                            None. Estimated Blood Loss:     Estimated blood loss: none. Impression:               - Two 7 to 8 mm polyps in the ascending colon and                            in the cecum, removed with a cold snare. Resected                            and retrieved.                           - External and internal hemorrhoids.                           - The examination was otherwise normal on direct                            and retroflexion views. Recommendation:           - Repeat colonoscopy for surveillance  based on                            pathology results.                           - Patient has a contact number available for                            emergencies. The signs and symptoms of potential                            delayed complications were discussed with the                            patient. Return to normal activities tomorrow.                            Written discharge instructions were provided to the                            patient.                           -  Resume previous diet.                           - Continue present medications.                           - Await pathology results. Ladene Artist, MD 09/14/2018 1:55:59 PM This report has been signed electronically.

## 2018-09-14 NOTE — Progress Notes (Signed)
To PACU, VSS. Report to RN.tb 

## 2018-09-15 ENCOUNTER — Ambulatory Visit
Admission: RE | Admit: 2018-09-15 | Discharge: 2018-09-15 | Disposition: A | Discharge status: Home / Self Care | Payer: BC Managed Care – PPO | Source: Ambulatory Visit | Attending: Internal Medicine | Admitting: Internal Medicine

## 2018-09-15 DIAGNOSIS — E785 Hyperlipidemia, unspecified: Secondary | ICD-10-CM

## 2018-09-16 ENCOUNTER — Telehealth: Payer: Self-pay

## 2018-09-16 NOTE — Telephone Encounter (Signed)
  Follow up Call-  Call back number 09/14/2018  Post procedure Call Back phone  # [phone]  Permission to leave phone message Yes  Some recent data might be hidden     Patient questions:  Do you have a fever, pain , or abdominal swelling? No. Pain Score  0 *  Have you tolerated food without any problems? Yes  Have you been able to return to your normal activities? Yes  Do you have any questions about your discharge instructions: Diet   No. Medications  No. Follow up visit  No.  Do you have questions or concerns about your Care? No.  Actions: * If pain score is 4 or above: No action needed, pain <4.  1. Have you developed a fever since your procedure? No  2.   Have you had an respiratory symptoms (SOB or cough) since your procedure? No  3.   Have you tested positive for COVID 19 since your procedure No  4.   Have you had any family members/close contacts diagnosed with the COVID 19 since your procedure?  No   If yes to any of these questions please route to Joylene John, RN and Alphonsa Gin, RN.

## 2018-09-24 ENCOUNTER — Encounter: Payer: Self-pay | Admitting: Gastroenterology

## 2018-09-29 DIAGNOSIS — M62830 Muscle spasm of back: Secondary | ICD-10-CM | POA: Diagnosis not present

## 2018-09-29 DIAGNOSIS — M79652 Pain in left thigh: Secondary | ICD-10-CM | POA: Diagnosis not present

## 2018-09-29 DIAGNOSIS — M9901 Segmental and somatic dysfunction of cervical region: Secondary | ICD-10-CM | POA: Diagnosis not present

## 2018-09-29 DIAGNOSIS — M545 Low back pain: Secondary | ICD-10-CM | POA: Diagnosis not present

## 2018-10-14 DIAGNOSIS — M791 Myalgia, unspecified site: Secondary | ICD-10-CM | POA: Diagnosis not present

## 2018-10-14 DIAGNOSIS — M9908 Segmental and somatic dysfunction of rib cage: Secondary | ICD-10-CM | POA: Diagnosis not present

## 2018-10-14 DIAGNOSIS — M5414 Radiculopathy, thoracic region: Secondary | ICD-10-CM | POA: Diagnosis not present

## 2018-10-14 DIAGNOSIS — M9902 Segmental and somatic dysfunction of thoracic region: Secondary | ICD-10-CM | POA: Diagnosis not present

## 2018-10-19 ENCOUNTER — Ambulatory Visit
Admission: RE | Admit: 2018-10-19 | Discharge: 2018-10-19 | Disposition: A | Discharge status: Home / Self Care | Payer: BC Managed Care – PPO | Source: Ambulatory Visit | Attending: Internal Medicine | Admitting: Internal Medicine

## 2018-10-19 ENCOUNTER — Other Ambulatory Visit: Payer: Self-pay

## 2018-10-19 DIAGNOSIS — Z1231 Encounter for screening mammogram for malignant neoplasm of breast: Secondary | ICD-10-CM

## 2018-11-04 DIAGNOSIS — M9902 Segmental and somatic dysfunction of thoracic region: Secondary | ICD-10-CM | POA: Diagnosis not present

## 2018-11-04 DIAGNOSIS — M9903 Segmental and somatic dysfunction of lumbar region: Secondary | ICD-10-CM | POA: Diagnosis not present

## 2018-11-04 DIAGNOSIS — M9905 Segmental and somatic dysfunction of pelvic region: Secondary | ICD-10-CM | POA: Diagnosis not present

## 2018-11-04 DIAGNOSIS — M5417 Radiculopathy, lumbosacral region: Secondary | ICD-10-CM | POA: Diagnosis not present

## 2018-11-11 DIAGNOSIS — M9905 Segmental and somatic dysfunction of pelvic region: Secondary | ICD-10-CM | POA: Diagnosis not present

## 2018-11-11 DIAGNOSIS — M5417 Radiculopathy, lumbosacral region: Secondary | ICD-10-CM | POA: Diagnosis not present

## 2018-11-11 DIAGNOSIS — M9902 Segmental and somatic dysfunction of thoracic region: Secondary | ICD-10-CM | POA: Diagnosis not present

## 2018-11-11 DIAGNOSIS — M9903 Segmental and somatic dysfunction of lumbar region: Secondary | ICD-10-CM | POA: Diagnosis not present

## 2018-11-17 DIAGNOSIS — M9903 Segmental and somatic dysfunction of lumbar region: Secondary | ICD-10-CM | POA: Diagnosis not present

## 2018-11-17 DIAGNOSIS — M9905 Segmental and somatic dysfunction of pelvic region: Secondary | ICD-10-CM | POA: Diagnosis not present

## 2018-11-17 DIAGNOSIS — M9902 Segmental and somatic dysfunction of thoracic region: Secondary | ICD-10-CM | POA: Diagnosis not present

## 2018-11-17 DIAGNOSIS — M5417 Radiculopathy, lumbosacral region: Secondary | ICD-10-CM | POA: Diagnosis not present

## 2018-11-26 DIAGNOSIS — Z23 Encounter for immunization: Secondary | ICD-10-CM | POA: Diagnosis not present

## 2018-12-14 DIAGNOSIS — N390 Urinary tract infection, site not specified: Secondary | ICD-10-CM | POA: Diagnosis not present

## 2018-12-22 DIAGNOSIS — E039 Hypothyroidism, unspecified: Secondary | ICD-10-CM | POA: Diagnosis not present

## 2018-12-22 DIAGNOSIS — Z23 Encounter for immunization: Secondary | ICD-10-CM | POA: Diagnosis not present

## 2018-12-22 DIAGNOSIS — E785 Hyperlipidemia, unspecified: Secondary | ICD-10-CM | POA: Diagnosis not present

## 2019-01-18 DIAGNOSIS — M5417 Radiculopathy, lumbosacral region: Secondary | ICD-10-CM | POA: Diagnosis not present

## 2019-01-18 DIAGNOSIS — M9905 Segmental and somatic dysfunction of pelvic region: Secondary | ICD-10-CM | POA: Diagnosis not present

## 2019-01-18 DIAGNOSIS — M9902 Segmental and somatic dysfunction of thoracic region: Secondary | ICD-10-CM | POA: Diagnosis not present

## 2019-01-18 DIAGNOSIS — M9903 Segmental and somatic dysfunction of lumbar region: Secondary | ICD-10-CM | POA: Diagnosis not present

## 2019-01-21 DIAGNOSIS — Z20828 Contact with and (suspected) exposure to other viral communicable diseases: Secondary | ICD-10-CM | POA: Diagnosis not present

## 2019-02-01 DIAGNOSIS — M9905 Segmental and somatic dysfunction of pelvic region: Secondary | ICD-10-CM | POA: Diagnosis not present

## 2019-02-01 DIAGNOSIS — M9902 Segmental and somatic dysfunction of thoracic region: Secondary | ICD-10-CM | POA: Diagnosis not present

## 2019-02-01 DIAGNOSIS — M9903 Segmental and somatic dysfunction of lumbar region: Secondary | ICD-10-CM | POA: Diagnosis not present

## 2019-02-01 DIAGNOSIS — M5417 Radiculopathy, lumbosacral region: Secondary | ICD-10-CM | POA: Diagnosis not present

## 2019-02-11 ENCOUNTER — Other Ambulatory Visit: Payer: Self-pay

## 2019-02-12 ENCOUNTER — Encounter: Payer: Self-pay | Admitting: Obstetrics & Gynecology

## 2019-02-12 ENCOUNTER — Ambulatory Visit: Payer: BC Managed Care – PPO | Admitting: Obstetrics & Gynecology

## 2019-02-12 ENCOUNTER — Encounter

## 2019-02-12 ENCOUNTER — Ambulatory Visit: Payer: BLUE CROSS/BLUE SHIELD | Admitting: Obstetrics & Gynecology

## 2019-02-12 VITALS — BP 118/64 | HR 71 | Ht 66.0 in | Wt 183.0 lb

## 2019-02-12 DIAGNOSIS — Z01419 Encounter for gynecological examination (general) (routine) without abnormal findings: Secondary | ICD-10-CM

## 2019-02-12 NOTE — Progress Notes (Signed)
63 y.o. G0P0000 Married White or Caucasian female here for annual exam.  Going back to [place] on Sunday.  They will be gone until May.    Denies vaginal bleeding.    PCP:  Had appt with Dr. Brigitte Pulse this summer.  It was a virtual visit.    Patient's last menstrual period was 12/03/2010.          Sexually active: Yes.    The current method of family planning is post menopausal status.    Exercising: Yes.    walking Smoker:  no  Health Maintenance: Pap:   10/01/16 Neg. HR HPV:neg              07/19/14 Neg History of abnormal Pap:  Yes remote HX (Has been at least 20 years) MMG:  10/20/18 density B Bi-rads 1 neg  Colonoscopy:  09/14/18 polyps.  Dr. Fuller Plan.  Follow up 5 years.   BMD:  2018 Normal   TDaP:  01/17/2019 Pneumonia vaccine(s):  Not indicate Shingrix: 08/21/17  10/23/17  Hep C testing: 08/10/15 neg Screening Labs: PCP   reports that she has never smoked. She has never used smokeless tobacco. She reports current alcohol use of about 2.0 - 3.0 standard drinks of alcohol per week. She reports that she does not use drugs.  Past Medical History:  Diagnosis Date  . Abnormal Pap smear of cervix    chemicl tx, cone bx, and laser  . Allergy   . Goiter    multinodular goiter--negative bx   . Migraine   . Thyroid disease    hypothyroidism    Past Surgical History:  Procedure Laterality Date  . BLEPHAROPLASTY  2012      . CERVIX LESION DESTRUCTION  1990   chemical tx'd x2, cone bx and laser  . COLONOSCOPY      Current Outpatient Medications  Medication Sig Dispense Refill  . albuterol (VENTOLIN HFA) 108 (90 Base) MCG/ACT inhaler INHALE 2 PUFFS EVERY 6 HOURS AS NEEDED FOR COUGH OR WHEEZE    . Ascorbic Acid (VITAMIN C PO) Take by mouth daily.    Marland Kitchen BIOTIN PO Take 5,000 mcg by mouth daily.     . Calcium Carbonate-Vitamin D (CALCIUM + D PO) Take by mouth. 600mg  and minerals    . Cholecalciferol (VITAMIN D PO) Take by mouth daily.    . Cyanocobalamin (VITAMIN B 12 PO) Take by mouth  daily.    Marland Kitchen estradiol (ESTRING) 2 MG vaginal ring Place 2 mg vaginally every 3 (three) months. 1 each 4  . fexofenadine (ALLEGRA) 180 MG tablet Take 180 mg by mouth as needed.    Marland Kitchen levothyroxine (SYNTHROID, LEVOTHROID) 125 MCG tablet Take 125 mcg by mouth daily before breakfast. One whole tablet 6 days a week and Once a week takes 1/2 pill    . Multiple Vitamins-Minerals (MULTIVITAMIN PO) Take by mouth daily.    . Probiotic Product (PROBIOTIC PO) Take by mouth daily.    . valACYclovir (VALTREX) 500 MG tablet TAKE 1 CAPLET BY MOUTH TWICE DAILY AS NEEDED FOR OUTBREAK     Current Facility-Administered Medications  Medication Dose Route Frequency Provider Last Rate Last Admin  . 0.9 %  sodium chloride infusion  500 mL Intravenous Once Ladene Artist, MD        Family History  Problem Relation Age of Onset  . Parkinson's disease Brother   . Colon cancer Paternal Uncle   . Colon cancer Paternal Uncle   . Colon polyps Neg Hx   .  Esophageal cancer Neg Hx   . Rectal cancer Neg Hx   . Stomach cancer Neg Hx     Review of Systems  All other systems reviewed and are negative.   Exam:   Vitals:   02/12/19 1408  BP: 118/64  Pulse: 71  SpO2: 97%    General appearance: alert, cooperative and appears stated age Head: Normocephalic, without obvious abnormality, atraumatic Neck: no adenopathy, supple, symmetrical, trachea midline and thyroid normal to inspection and palpation Lungs: clear to auscultation bilaterally Breasts: normal appearance, no masses or tenderness Heart: regular rate and rhythm Abdomen: soft, non-tender; bowel sounds normal; no masses,  no organomegaly Extremities: extremities normal, atraumatic, no cyanosis or edema Skin: Skin color, texture, turgor normal. No rashes or lesions Lymph nodes: Cervical, supraclavicular, and axillary nodes normal. No abnormal inguinal nodes palpated Neurologic: Grossly normal   Pelvic: External genitalia:  no lesions               Urethra:  normal appearing urethra with no masses, tenderness or lesions              Bartholins and Skenes: normal                 Vagina: normal appearing vagina with normal color and discharge, no lesions              Cervix: no lesions              Pap taken: No. Bimanual Exam:  Uterus:  normal size, contour, position, consistency, mobility, non-tender              Adnexa: normal adnexa and no mass, fullness, tenderness               Rectovaginal: Confirms               Anus:  normal sphincter tone, no lesions  Chaperone was present for exam.  A:  Well Woman with normal exam PMP, no HRT H/O abnormal pap smears >20 + yeas ago Hypothyroidism Vaginal atrophic changes H/o vulvar condyloma, s/p excision  P:   Mammogram guidelines reviewed.  Doing 3D MMGs. pap smear with neg HR HPV 09/2016 Lab work done with Dr. Brigitte Pulse in the summer Will call when needs RF for Estring 2mg  pv q 3 months (will need paper prescription) and Valtrex  Colonoscopy is UTD Normal BMD in 2018 Return annually or prn

## 2019-03-02 MED FILL — SYNTHROID 125 MCG TABLET: 125 | 90 days supply | Qty: 84 | Fill #0

## 2019-03-16 DIAGNOSIS — M545 Low back pain: Secondary | ICD-10-CM | POA: Diagnosis not present

## 2019-03-16 DIAGNOSIS — M62452 Contracture of muscle, left thigh: Secondary | ICD-10-CM | POA: Diagnosis not present

## 2019-03-16 DIAGNOSIS — M62830 Muscle spasm of back: Secondary | ICD-10-CM | POA: Diagnosis not present

## 2019-03-16 DIAGNOSIS — G5712 Meralgia paresthetica, left lower limb: Secondary | ICD-10-CM | POA: Diagnosis not present

## 2019-03-18 DIAGNOSIS — Z79899 Other long term (current) drug therapy: Secondary | ICD-10-CM | POA: Diagnosis not present

## 2019-03-18 DIAGNOSIS — E039 Hypothyroidism, unspecified: Secondary | ICD-10-CM | POA: Diagnosis not present

## 2019-03-18 DIAGNOSIS — E785 Hyperlipidemia, unspecified: Secondary | ICD-10-CM | POA: Diagnosis not present

## 2019-03-29 DIAGNOSIS — M62452 Contracture of muscle, left thigh: Secondary | ICD-10-CM | POA: Diagnosis not present

## 2019-03-29 DIAGNOSIS — M62830 Muscle spasm of back: Secondary | ICD-10-CM | POA: Diagnosis not present

## 2019-03-29 DIAGNOSIS — G5712 Meralgia paresthetica, left lower limb: Secondary | ICD-10-CM | POA: Diagnosis not present

## 2019-03-29 DIAGNOSIS — M545 Low back pain: Secondary | ICD-10-CM | POA: Diagnosis not present

## 2019-04-05 DIAGNOSIS — M62830 Muscle spasm of back: Secondary | ICD-10-CM | POA: Diagnosis not present

## 2019-04-05 DIAGNOSIS — M62452 Contracture of muscle, left thigh: Secondary | ICD-10-CM | POA: Diagnosis not present

## 2019-04-05 DIAGNOSIS — M545 Low back pain: Secondary | ICD-10-CM | POA: Diagnosis not present

## 2019-04-05 DIAGNOSIS — G5712 Meralgia paresthetica, left lower limb: Secondary | ICD-10-CM | POA: Diagnosis not present

## 2019-04-12 DIAGNOSIS — M545 Low back pain: Secondary | ICD-10-CM | POA: Diagnosis not present

## 2019-04-12 DIAGNOSIS — G5712 Meralgia paresthetica, left lower limb: Secondary | ICD-10-CM | POA: Diagnosis not present

## 2019-04-12 DIAGNOSIS — M62452 Contracture of muscle, left thigh: Secondary | ICD-10-CM | POA: Diagnosis not present

## 2019-04-12 DIAGNOSIS — M62830 Muscle spasm of back: Secondary | ICD-10-CM | POA: Diagnosis not present

## 2019-04-19 DIAGNOSIS — M62830 Muscle spasm of back: Secondary | ICD-10-CM | POA: Diagnosis not present

## 2019-04-19 DIAGNOSIS — G5712 Meralgia paresthetica, left lower limb: Secondary | ICD-10-CM | POA: Diagnosis not present

## 2019-04-19 DIAGNOSIS — M62452 Contracture of muscle, left thigh: Secondary | ICD-10-CM | POA: Diagnosis not present

## 2019-04-19 DIAGNOSIS — M545 Low back pain: Secondary | ICD-10-CM | POA: Diagnosis not present

## 2019-05-24 DIAGNOSIS — M62830 Muscle spasm of back: Secondary | ICD-10-CM | POA: Diagnosis not present

## 2019-05-24 DIAGNOSIS — M545 Low back pain: Secondary | ICD-10-CM | POA: Diagnosis not present

## 2019-05-24 DIAGNOSIS — M62452 Contracture of muscle, left thigh: Secondary | ICD-10-CM | POA: Diagnosis not present

## 2019-05-24 DIAGNOSIS — G5712 Meralgia paresthetica, left lower limb: Secondary | ICD-10-CM | POA: Diagnosis not present

## 2019-06-26 ENCOUNTER — Encounter: Payer: Self-pay | Admitting: Obstetrics & Gynecology

## 2019-06-28 ENCOUNTER — Telehealth: Payer: Self-pay | Admitting: *Deleted

## 2019-06-28 NOTE — Telephone Encounter (Signed)
Ciro Backer "Idelle Leech [facility] Clinical Pool  Phone Number: [phone]  Hi Dr Sabra Heck,  [facility] you are well. I wanted to let you know that about 3 weeks ago I had bleeding one day. I just thought it was a fluke as it was just a little on one day. Then one day this week I had the same thing. Not a whole lot for just one day. Usually I have a little pink after sex but this was more than that and we had not had sex. I also have had some discharge for the last 2 month or so. We come back to [place] on 5/3. Was not sure if I should come be seen or not. My next regular visit is not for a while.   My number is: [phone]  Thank you,  Ammi Pavlik

## 2019-06-28 NOTE — Telephone Encounter (Signed)
Spoke with patient. Patient reports spotting 3 wks ago x1 and again this past week x1. Reports pain with intercourse and pink d/c after, this has been going on for a few months. Reports brown to yellow/gray vaginal d/c with odor for 2 months. Uses coconut oil with intercourse, no change in symptoms. Patient is currently out of town, denies any pain or bleeding.   OV scheduled for 07/06/19 at 10:30am with Dr. Sabra Heck. Patient verbalizes understanding and is agreeable.   Last AEX 02/12/19  Routing to provider for final review. Patient is agreeable to disposition. Will close encounter.

## 2019-06-28 NOTE — Telephone Encounter (Signed)
Left message to call Sharee Pimple, RN at [facility].   Last AEX 02/12/19

## 2019-07-01 NOTE — Progress Notes (Signed)
GYNECOLOGY  VISIT  CC:   Vaginal irritation  HPI: 64 y.o. G0P0000 Married White or Caucasian female here for vaginal irritation that has been present for about a month.  She's had irritation and itching.  She's also had two episodes of spotting.  This was not related to intercourse.  She noted this morning she started having some urinary pressure and urinary urgency.  Still uses estring.    GYNECOLOGIC HISTORY: Patient's last menstrual period was 12/03/2010. Contraception: post menopausal Menopausal hormone therapy: estring poct urine-wbc 1+, rbc tr  Patient Active Problem List   Diagnosis Date Noted  . Atrophic vaginitis 02/03/2015  . Unspecified hypothyroidism 07/13/2013    Past Medical History:  Diagnosis Date  . Abnormal Pap smear of cervix    chemicl tx, cone bx, and laser  . Allergy   . Goiter    multinodular goiter--negative bx   . Migraine   . Thyroid disease    hypothyroidism    Past Surgical History:  Procedure Laterality Date  . BLEPHAROPLASTY  2012      . CERVIX LESION DESTRUCTION  1990   chemical tx'd x2, cone bx and laser  . COLONOSCOPY      MEDS:   Current Outpatient Medications on File Prior to Visit  Medication Sig Dispense Refill  . Ascorbic Acid (VITAMIN C PO) Take by mouth daily.    Marland Kitchen BIOTIN PO Take 5,000 mcg by mouth daily.     . Cholecalciferol (VITAMIN D PO) Take by mouth daily.    . Cranberry 1000 MG CAPS Take by mouth.    . Cyanocobalamin (VITAMIN B 12 PO) Take by mouth daily.    Marland Kitchen estradiol (ESTRING) 2 MG vaginal ring Place 2 mg vaginally every 3 (three) months. 1 each 4  . fexofenadine (ALLEGRA) 180 MG tablet Take 180 mg by mouth as needed.    Marland Kitchen levothyroxine (SYNTHROID, LEVOTHROID) 125 MCG tablet Take 125 mcg by mouth daily before breakfast. One whole tablet 6 days a week and Once a week takes 1/2 pill    . magnesium 30 MG tablet Take 30 mg by mouth 2 (two) times daily.    . Multiple Vitamins-Minerals (MULTIVITAMIN PO) Take by mouth  daily.    . potassium bicarbonate (K-LYTE) 25 MEQ disintegrating tablet Take 25 mEq by mouth 2 (two) times daily.    Marland Kitchen PREBIOTIC PRODUCT PO Take by mouth.    . Probiotic Product (PROBIOTIC PO) Take by mouth daily.    . valACYclovir (VALTREX) 500 MG tablet TAKE 1 CAPLET BY MOUTH TWICE DAILY AS NEEDED FOR OUTBREAK     Current Facility-Administered Medications on File Prior to Visit  Medication Dose Route Frequency Provider Last Rate Last Admin  . 0.9 %  sodium chloride infusion  500 mL Intravenous Once Ladene Artist, MD        ALLERGIES: Gilberto Better [ospemifene] and Other  Family History  Problem Relation Age of Onset  . Parkinson's disease Brother   . Colon cancer Paternal Uncle   . Colon cancer Paternal Uncle   . Colon polyps Neg Hx   . Esophageal cancer Neg Hx   . Rectal cancer Neg Hx   . Stomach cancer Neg Hx     SH:  Married, non smoker  Review of Systems  Constitutional: Negative.   HENT: Negative.   Eyes: Negative.   Respiratory: Negative.   Cardiovascular: Negative.   Gastrointestinal: Negative.   Endocrine: Negative.   Genitourinary:       Post menopausal  bleeding  Musculoskeletal: Negative.   Skin: Negative.   Allergic/Immunologic: Negative.   Neurological: Negative.   Psychiatric/Behavioral: Negative.     PHYSICAL EXAMINATION:    BP 118/80   Pulse 72   Temp 97.7 F (36.5 C) (Skin)   Resp 16   Wt 175 lb (79.4 kg)   LMP 12/03/2010   BMI 28.25 kg/m     General appearance: alert, cooperative and appears stated age Lymph:  no inguinal LAD noted  Pelvic: External genitalia:  no lesions              Urethra:  erythematous appearing urethra with no masses, tenderness or lesions              Bartholins and Skenes: normal                 Vagina: erythematous vaginal mucosa with darkish d/c consistent with atrophic changes, no blood at cervical os              Cervix: no lesions and no blood present              Bimanual Exam:  Uterus:  normal size, contour,  position, consistency, mobility, non-tender              Adnexa: no mass, fullness, tenderness  Chaperone, Terence Lux, CMA, was present for exam.  Assessment: Atrophic vaginitis with darkish/yellowish d/c Urethral irritation Desires minimal or no systemic estrogen products No evidence of PMP bleeding, symptoms appear to be from atrophy  Plan: Affirm pending tindamax 1 gram po x 5 days Continue estring 2mg  pv q 3 months.  Will try Vit E vaginal suppositories, 200u/ml, one pv three times weekly.  Has used vagifem without improvement.  Does not like vaginal creams.  Tried Osphena but has side effects.  Declines intrarosa use. May need PUS depending on results and response to vaginal Vit E suppositories Urine culture and micro pending

## 2019-07-05 ENCOUNTER — Other Ambulatory Visit: Payer: Self-pay

## 2019-07-06 ENCOUNTER — Telehealth: Payer: Self-pay | Admitting: Obstetrics & Gynecology

## 2019-07-06 ENCOUNTER — Encounter: Payer: Self-pay | Admitting: Obstetrics & Gynecology

## 2019-07-06 ENCOUNTER — Ambulatory Visit (INDEPENDENT_AMBULATORY_CARE_PROVIDER_SITE_OTHER): Payer: BC Managed Care – PPO | Admitting: Obstetrics & Gynecology

## 2019-07-06 ENCOUNTER — Other Ambulatory Visit: Payer: Self-pay

## 2019-07-06 VITALS — BP 118/80 | HR 72 | Temp 97.7°F | Resp 16 | Wt 175.0 lb

## 2019-07-06 DIAGNOSIS — N952 Postmenopausal atrophic vaginitis: Secondary | ICD-10-CM

## 2019-07-06 DIAGNOSIS — N950 Postmenopausal bleeding: Secondary | ICD-10-CM

## 2019-07-06 DIAGNOSIS — N898 Other specified noninflammatory disorders of vagina: Secondary | ICD-10-CM | POA: Diagnosis not present

## 2019-07-06 DIAGNOSIS — R102 Pelvic and perineal pain: Secondary | ICD-10-CM | POA: Diagnosis not present

## 2019-07-06 LAB — POCT URINALYSIS DIPSTICK
Bilirubin, UA: NEGATIVE
Glucose, UA: NEGATIVE
Ketones, UA: NEGATIVE
Nitrite, UA: NEGATIVE
Protein, UA: NEGATIVE
Urobilinogen, UA: NEGATIVE E.U./dL — AB
pH, UA: 5 (ref 5.0–8.0)

## 2019-07-06 MED ORDER — NONFORMULARY OR COMPOUNDED ITEM: 3 refills | Status: DC

## 2019-07-06 MED ORDER — TINIDAZOLE 500 MG PO TABS: ORAL_TABLET | ORAL | 0 refills | Status: DC

## 2019-07-06 NOTE — Telephone Encounter (Signed)
Visit Follow-Up Question Received: Today Message Contents  Olivia, Ellison "Olivia Ellison" sent to [facility] Clinical Pool  Phone Number: [phone]  Hi, I was in the office this AM and was given a Rx for Tinidazole. The directions are to take 2 tablets by mouth once a day for 5 days, however the quantity is only 8. Do I take it for only 4 days or do I need another Rx to pickup 2 more tablets? Thank you, Olivia Ellison 05/01/1955

## 2019-07-06 NOTE — Telephone Encounter (Signed)
Per review of 07/06/19 Rx Tindamax 500 mg tab Take 1 gram po daily x 5 days #8/0RF   Rx pended for 2 additional tabs with note to pharmacy   Routing to Dr. Sabra Heck

## 2019-07-07 ENCOUNTER — Telehealth: Payer: Self-pay | Admitting: Obstetrics & Gynecology

## 2019-07-07 LAB — URINALYSIS, MICROSCOPIC ONLY
Bacteria, UA: NONE SEEN
Casts: NONE SEEN /lpf
Epithelial Cells (non renal): NONE SEEN /hpf (ref 0–10)
RBC, Urine: NONE SEEN /hpf (ref 0–2)

## 2019-07-07 LAB — VAGINITIS/VAGINOSIS, DNA PROBE
Candida Species: NEGATIVE
Gardnerella vaginalis: POSITIVE — AB
Trichomonas vaginosis: NEGATIVE

## 2019-07-07 MED ORDER — TINIDAZOLE 500 MG PO TABS: ORAL_TABLET | ORAL | 0 refills | Status: DC

## 2019-07-07 NOTE — Telephone Encounter (Signed)
I'm ok with a biopsy if still has some bleeding after treatment.  Ok to remove PUS order.  Thanks.  Routing to [employer] as well.  She should have follow up in about 3 weeks.  Thanks.

## 2019-07-07 NOTE — Telephone Encounter (Signed)
Call to patient to notify of additional Rx. Left detailed message, ok per dpr. Advised to take Tindamax 1 gram (2 tabs) po daily for 5 days. Return call to office if any additional questions.

## 2019-07-07 NOTE — Telephone Encounter (Signed)
Spoke with patient regarding benefits for recommended ultrasound. Patient declines to schedule ultrasound and stated, "I'd rather wait and if she still feels I need something, we can do a biopsy cause I've had one of those before." Informed patient that I would send message to Dr. Sabra Heck and a nurse would return her call if there were any additional recommendations. Patient agreeable.  Routing to Dr. Sabra Heck to advise.

## 2019-07-08 LAB — URINE CULTURE: Organism ID, Bacteria: NO GROWTH

## 2019-07-08 NOTE — Telephone Encounter (Signed)
Patient notified. 3week f/u appt with possible endo bx scheduled for 07-29-2019 at 8:30am.

## 2019-07-12 ENCOUNTER — Telehealth: Payer: Self-pay

## 2019-07-12 ENCOUNTER — Encounter: Payer: Self-pay | Admitting: Obstetrics & Gynecology

## 2019-07-12 NOTE — Telephone Encounter (Signed)
OV 5/4 for vaginal atrophy +BV 5/4, treated with tindamax PMB, has endo bx scheduled 07/29/19 Estring Rx and Vit E vag. Suppositories.   Spoke with pt. Pt reports finishing tindamax Rx on Sat 5/8 and is still having discharge and odor. Pt states discharge has not gone away. Denies vaginal itching, fever, chills.  Pt also states having some vaginal spotting on Saturday only. Denies having any today. Pt is scheduled for endometrial bx on 5/27. Pt advised to be seen for further evaluation and follow up from [place] treatment. Pt agreeable. Pt scheduled with Dr Sabra Heck on 5/11 at 8:30 am. Pt agreeable and verbalized understanding. CPS Neg.   Routing to Dr Sabra Heck for review. Encounter closed.

## 2019-07-12 NOTE — Telephone Encounter (Signed)
Olivia Ellison "Olivia Ellison Clinical Pool  Phone Number: [phone]  Hi, I just wanted to update you on my issues. I finished the 5 days of antibiotic on Sat AM. I continue to have an odorous discharge and did have some bleeding on Sat.  I have a follow up appt w/Dr Sabra Heck on 5/27. Didn't know if I needed to come in sooner or maybe it takes the antibiotic longer to work than the 5 days. Thank you, Olivia Ellison

## 2019-07-12 NOTE — Progress Notes (Signed)
GYNECOLOGY  VISIT  CC:   Patient complains of still having discharge and odor. Per patient on Saturday, had a small amount of blood in underwear but none since.  HPI: 64 y.o. G0P0000 Married White or Caucasian female here for vaginal discharge follow up.  Patient has long history of atrophic vaginal changes.  She has been trying to use minimal estrogen therapy for treatment.  She has used an Teaching laboratory technician with some success.  However, this does not seem to be working as well for her.  She lives in [place] part of the year and was having increased vaginal discharge for several months before being seen recently.  She was diagnosed with BV.  She was treated with oral therapy.  She reports she had significant metallic taste with the medication.  She did complete it.  However discharge seems to be the same.  She is not due to replace her Estring for another week.  She is noticing some streaky blood in her discharge as well.  GYNECOLOGIC HISTORY: Patient's last menstrual period was 12/03/2010. Contraception: post menopausal Menopausal hormone therapy: estring  Patient Active Problem List   Diagnosis Date Noted  . Atrophic vaginitis 02/03/2015  . Unspecified hypothyroidism 07/13/2013    Past Medical History:  Diagnosis Date  . Abnormal Pap smear of cervix    chemicl tx, cone bx, and laser  . Allergy   . Goiter    multinodular goiter--negative bx   . Migraine   . Thyroid disease    hypothyroidism    Past Surgical History:  Procedure Laterality Date  . BLEPHAROPLASTY  2012      . CERVIX LESION DESTRUCTION  1990   chemical tx'd x2, cone bx and laser  . COLONOSCOPY      MEDS:   Current Outpatient Medications on File Prior to Visit  Medication Sig Dispense Refill  . Ascorbic Acid (VITAMIN C PO) Take by mouth daily.    Marland Kitchen BIOTIN PO Take 5,000 mcg by mouth daily.     . Cholecalciferol (VITAMIN D PO) Take by mouth daily.    . Cranberry 1000 MG CAPS Take by mouth.    . Cyanocobalamin (VITAMIN B  12 PO) Take by mouth daily.    Marland Kitchen estradiol (ESTRING) 2 MG vaginal ring Place 2 mg vaginally every 3 (three) months. 1 each 4  . fexofenadine (ALLEGRA) 180 MG tablet Take 180 mg by mouth as needed.    Marland Kitchen levothyroxine (SYNTHROID, LEVOTHROID) 125 MCG tablet Take 125 mcg by mouth daily before breakfast. One whole tablet 6 days a week and Once a week takes 1/2 pill    . magnesium 30 MG tablet Take 30 mg by mouth 2 (two) times daily.    . Multiple Vitamins-Minerals (MULTIVITAMIN PO) Take by mouth daily.    . NONFORMULARY OR COMPOUNDED ITEM Vitamin E vaginal suppositories 200u/ml.  One pv three times weekly. 36 each 3  . potassium bicarbonate (K-LYTE) 25 MEQ disintegrating tablet Take 25 mEq by mouth 2 (two) times daily.    Marland Kitchen PREBIOTIC PRODUCT PO Take by mouth.    . Probiotic Product (PROBIOTIC PO) Take by mouth daily.    . valACYclovir (VALTREX) 500 MG tablet TAKE 1 CAPLET BY MOUTH TWICE DAILY AS NEEDED FOR OUTBREAK     Current Facility-Administered Medications on File Prior to Visit  Medication Dose Route Frequency Provider Last Rate Last Admin  . 0.9 %  sodium chloride infusion  500 mL Intravenous Once Ladene Artist, MD  ALLERGIES: Osphena [ospemifene] and Other  Family History  Problem Relation Age of Onset  . Parkinson's disease Brother   . Colon cancer Paternal Uncle   . Colon cancer Paternal Uncle   . Colon polyps Neg Hx   . Esophageal cancer Neg Hx   . Rectal cancer Neg Hx   . Stomach cancer Neg Hx     SH: Married, non-smoker  Review of Systems  Genitourinary: Positive for vaginal discharge.       Vaginal odor  All other systems reviewed and are negative.   PHYSICAL EXAMINATION:    BP 122/70 (BP Location: Right Arm, Patient Position: Sitting, Cuff Size: Normal)   Pulse 76   Temp (!) 97.3 F (36.3 C) (Temporal)   Ht 5' 5.5" (1.664 m)   Wt 175 lb (79.4 kg)   LMP 12/03/2010   BMI 28.68 kg/m     General appearance: alert, cooperative and appears stated  age Lymph:  no inguinal LAD noted  Pelvic: External genitalia:  no lesions              Urethra:  normal appearing urethra with no masses, tenderness or lesions              Bartholins and Skenes: normal                 Vagina: Atrophic appearing vaginal mucosa with yellowish/creamy appearing discharge.  Estring is present.  No visible lesions noted              Cervix: no lesions and Atrophic in appearance              Bimanual Exam:  Uterus:  normal size, contour, position, consistency, mobility, non-tender              Adnexa: no mass, fullness, tenderness              Rectovaginal: No.  Chaperone, Terence Lux, CMA, was present for exam.  Assessment: Vaginal discharge with significant vaginal atrophy Estring use Questionable postmenopausal bleeding versus vaginitis  Plan: Repeat vaginitis testing obtained today. She is can return for pelvic ultrasound to evaluate the endometrium to ensure that this bleeding is due to her vaginitis symptoms and not from the endometrium. Alternative options discussed with patient.  She really does not want to use a vaginal cream but knows this might be necessary for 2 to 3 months. Rx for Metro[employer]el [id number], 1 applicator nightly x5 nights sent to pharmacy on file. We will currently plan to leave Estring out after next week when removal is due

## 2019-07-13 ENCOUNTER — Other Ambulatory Visit: Payer: Self-pay | Admitting: *Deleted

## 2019-07-13 ENCOUNTER — Ambulatory Visit (INDEPENDENT_AMBULATORY_CARE_PROVIDER_SITE_OTHER): Payer: BC Managed Care – PPO | Admitting: Obstetrics & Gynecology

## 2019-07-13 ENCOUNTER — Encounter: Payer: Self-pay | Admitting: Obstetrics & Gynecology

## 2019-07-13 ENCOUNTER — Telehealth: Payer: Self-pay

## 2019-07-13 ENCOUNTER — Other Ambulatory Visit: Payer: Self-pay

## 2019-07-13 VITALS — BP 122/70 | HR 76 | Temp 97.3°F | Ht 65.5 in | Wt 175.0 lb

## 2019-07-13 DIAGNOSIS — N952 Postmenopausal atrophic vaginitis: Secondary | ICD-10-CM | POA: Diagnosis not present

## 2019-07-13 DIAGNOSIS — N950 Postmenopausal bleeding: Secondary | ICD-10-CM

## 2019-07-13 DIAGNOSIS — N898 Other specified noninflammatory disorders of vagina: Secondary | ICD-10-CM

## 2019-07-13 MED ORDER — METRONIDAZOLE 0.75 % VA GEL: 1.0000 | Freq: Every day | VAGINAL | 0 refills | Status: DC

## 2019-07-13 MED ORDER — METRONIDAZOLE 0.75 % VA GEL
1.0000 | Freq: Every day | VAGINAL | 0 refills | Status: DC
Start: 2019-07-13 — End: 2019-11-22

## 2019-07-13 MED ORDER — METRONIDAZOLE 0.75 % VA GEL
1.0000 | Freq: Every day | VAGINAL | 0 refills | Status: DC
Start: 2019-07-13 — End: 2019-07-13

## 2019-07-13 NOTE — Telephone Encounter (Signed)
Pt scheduled for PUS with Dr Sabra Heck on 07/15/19 by Wilson Singer.  Pt denies any questions or concerns.  Routing to Dr Sabra Heck.  Encounter closed.

## 2019-07-13 NOTE — Telephone Encounter (Signed)
-----   Message from Megan Salon, MD sent at 07/13/2019  9:00 AM EDT ----- Regarding: PMP bleeding Pt needs ultrasound on Thursday for PMP bleeding.  Order has been placed.  Please precert and call and schedule.  Thanks.  Vinnie Level

## 2019-07-15 ENCOUNTER — Other Ambulatory Visit: Payer: Self-pay

## 2019-07-15 ENCOUNTER — Ambulatory Visit (INDEPENDENT_AMBULATORY_CARE_PROVIDER_SITE_OTHER): Payer: BC Managed Care – PPO | Admitting: Obstetrics & Gynecology

## 2019-07-15 ENCOUNTER — Ambulatory Visit (INDEPENDENT_AMBULATORY_CARE_PROVIDER_SITE_OTHER): Payer: BC Managed Care – PPO

## 2019-07-15 ENCOUNTER — Encounter: Payer: Self-pay | Admitting: Obstetrics & Gynecology

## 2019-07-15 VITALS — BP 120/78 | HR 76 | Temp 97.3°F | Resp 16 | Wt 176.0 lb

## 2019-07-15 DIAGNOSIS — N898 Other specified noninflammatory disorders of vagina: Secondary | ICD-10-CM

## 2019-07-15 DIAGNOSIS — D251 Intramural leiomyoma of uterus: Secondary | ICD-10-CM

## 2019-07-15 DIAGNOSIS — N952 Postmenopausal atrophic vaginitis: Secondary | ICD-10-CM | POA: Diagnosis not present

## 2019-07-15 DIAGNOSIS — N950 Postmenopausal bleeding: Secondary | ICD-10-CM | POA: Diagnosis not present

## 2019-07-15 LAB — NUSWAB BV AND CANDIDA, NAA
Candida albicans, NAA: NEGATIVE
Candida glabrata, NAA: NEGATIVE

## 2019-07-15 MED ORDER — ESTRADIOL 0.1 MG/GM VA CREA
TOPICAL_CREAM | VAGINAL | 1 refills | Status: DC
Start: 2019-07-15 — End: 2019-11-22

## 2019-07-15 NOTE — Progress Notes (Signed)
64 y.o. G0P0000 Married White or Caucasian female here for pelvic ultrasound due to postmenopausal spotting that has been occurring with the pt's most recent issues with vaginal discharge.  She has a long hx of vaginal atrophy.  Has been using estring.  Recently, this does not seem to be helping. She was tested for vaginitis which was positive for BV.  She was treated.  Repeat testing was negative.  Today, we discussed need to switch from current estring to option that will help vaginal mucosa better.  Will switch to estradiol cream 1 gram pv twice weekly.  She will remove current Estring.  Patient's last menstrual period was 12/03/2010.  Contraception: PMP  Findings:  UTERUS: 7.2 x 3.5 x 4.5cm with several intramural fibroids, largest 2.7cm EMS: 1.66mm ADNEXA: Left ovary: 2.0 x 0.9 x 1.2cm       Right ovary: 2.0 x 0.9 x 1.0cm  CUL DE SAC: no free fluid  Discussion:  Findings reveiwed with pt.  New findings of fibroids discussed.  Pt understands these are benign findings and do not increase the risk of any gyn malignancy.  No treatment or specific follow up needed at this time.  Also, ovaries are atrophic and endometrium is thin.  Endometrial biopsy not indicated at this time.    Will start estradiol cream.  Dosing and administration discussed.  Pt wants individual applicators for placement.  [place] for this discussed.  Assessment:  Vaginal spotting Atrophic vaginal changes Vaginal discharge Uterine fibroids  Plan:  Pt will start estradiol cream 1 gram pv twice weekly.  Recheck 3 months. Aware no follow up for fibroids needed.  ~20 minutes spent in discussion with pt regarding options for treatment of vaginal atrophic changes as well as typical symptoms, follow up, treatment for fibroids.

## 2019-07-29 ENCOUNTER — Ambulatory Visit: Payer: BC Managed Care – PPO | Admitting: Obstetrics & Gynecology

## 2019-08-24 DIAGNOSIS — L820 Inflamed seborrheic keratosis: Secondary | ICD-10-CM | POA: Diagnosis not present

## 2019-08-24 DIAGNOSIS — C44729 Squamous cell carcinoma of skin of left lower limb, including hip: Secondary | ICD-10-CM | POA: Diagnosis not present

## 2019-08-24 DIAGNOSIS — L570 Actinic keratosis: Secondary | ICD-10-CM | POA: Diagnosis not present

## 2019-08-24 DIAGNOSIS — L821 Other seborrheic keratosis: Secondary | ICD-10-CM | POA: Diagnosis not present

## 2019-08-24 DIAGNOSIS — D485 Neoplasm of uncertain behavior of skin: Secondary | ICD-10-CM | POA: Diagnosis not present

## 2019-08-24 DIAGNOSIS — Z85828 Personal history of other malignant neoplasm of skin: Secondary | ICD-10-CM | POA: Diagnosis not present

## 2019-08-24 DIAGNOSIS — C44619 Basal cell carcinoma of skin of left upper limb, including shoulder: Secondary | ICD-10-CM | POA: Diagnosis not present

## 2019-08-24 DIAGNOSIS — D1801 Hemangioma of skin and subcutaneous tissue: Secondary | ICD-10-CM | POA: Diagnosis not present

## 2019-08-24 DIAGNOSIS — B359 Dermatophytosis, unspecified: Secondary | ICD-10-CM | POA: Diagnosis not present

## 2019-08-26 DIAGNOSIS — H2513 Age-related nuclear cataract, bilateral: Secondary | ICD-10-CM | POA: Diagnosis not present

## 2019-08-26 DIAGNOSIS — H04123 Dry eye syndrome of bilateral lacrimal glands: Secondary | ICD-10-CM | POA: Diagnosis not present

## 2019-08-26 DIAGNOSIS — H10413 Chronic giant papillary conjunctivitis, bilateral: Secondary | ICD-10-CM | POA: Diagnosis not present

## 2019-08-26 DIAGNOSIS — H40013 Open angle with borderline findings, low risk, bilateral: Secondary | ICD-10-CM | POA: Diagnosis not present

## 2019-10-04 DIAGNOSIS — M62830 Muscle spasm of back: Secondary | ICD-10-CM | POA: Diagnosis not present

## 2019-10-04 DIAGNOSIS — M62452 Contracture of muscle, left thigh: Secondary | ICD-10-CM | POA: Diagnosis not present

## 2019-10-04 DIAGNOSIS — G5712 Meralgia paresthetica, left lower limb: Secondary | ICD-10-CM | POA: Diagnosis not present

## 2019-10-04 DIAGNOSIS — M545 Low back pain: Secondary | ICD-10-CM | POA: Diagnosis not present

## 2019-10-11 DIAGNOSIS — E042 Nontoxic multinodular goiter: Secondary | ICD-10-CM | POA: Diagnosis not present

## 2019-10-11 DIAGNOSIS — E7849 Other hyperlipidemia: Secondary | ICD-10-CM | POA: Diagnosis not present

## 2019-10-11 DIAGNOSIS — M859 Disorder of bone density and structure, unspecified: Secondary | ICD-10-CM | POA: Diagnosis not present

## 2019-10-11 DIAGNOSIS — Z0000 Encounter for general adult medical examination without abnormal findings: Secondary | ICD-10-CM | POA: Diagnosis not present

## 2019-10-11 DIAGNOSIS — R7301 Impaired fasting glucose: Secondary | ICD-10-CM | POA: Diagnosis not present

## 2019-10-14 DIAGNOSIS — Z0000 Encounter for general adult medical examination without abnormal findings: Secondary | ICD-10-CM | POA: Diagnosis not present

## 2019-10-14 DIAGNOSIS — R82998 Other abnormal findings in urine: Secondary | ICD-10-CM | POA: Diagnosis not present

## 2019-10-14 DIAGNOSIS — R030 Elevated blood-pressure reading, without diagnosis of hypertension: Secondary | ICD-10-CM | POA: Diagnosis not present

## 2019-10-15 DIAGNOSIS — Z1212 Encounter for screening for malignant neoplasm of rectum: Secondary | ICD-10-CM | POA: Diagnosis not present

## 2019-11-15 ENCOUNTER — Telehealth: Payer: Self-pay

## 2019-11-15 ENCOUNTER — Encounter: Payer: Self-pay | Admitting: Obstetrics & Gynecology

## 2019-11-15 NOTE — Telephone Encounter (Signed)
Pt sent following Mychart message:   [name], [name] Hi,  I have finished the tube of estrogen cream you prescribed me. I am still having some discharge but not near as much as before. I am also having some itching/irritation so things are still not 100% back to normal yet. Do I need to be seen again to address this? I will be in town for 2 weeks starting Tuesday.  Thank you  [name]

## 2019-11-15 NOTE — Telephone Encounter (Signed)
Spoke with pt. Pt sent mychart message to Dr Sabra Heck for update. Pt states finished estrogen cream this past weekend. Pt states still having slight odor, discharge an irritation. Pt states better, but not resolved. Pt states will be going out of town on 11/29/19 again and would like an OV.  OV scheduled with Dr Sabra Heck on 11/22/19 at 1130 am. Pt agreeable to date and time of appt. Pt declines earlier appt at this time. Advised to return call to office if sx worsen before OV. Pt verbalized understanding.  Routing to Dr Sabra Heck for update Encounter closed.

## 2019-11-18 ENCOUNTER — Telehealth: Payer: Self-pay

## 2019-11-18 NOTE — Telephone Encounter (Signed)
Spoke with patient. Patient states PCP contacted her today to started her on Macrobid x7 days for positive urine culture completed 5 wks ago. Patient states she is till experiencing vaginal irritation, discomfort, vaginal d/c and slight odor. Patient asking if OK to keep OV? Or will abx have any effect on visit? She is going to be going out of town 9/27.   Patient was seen 07/15/19, started on vaginal estrogen cream, was recommended to f/u in 3 mo. Recommended patient keep OV as scheduled for 9/20 for further evaluation of vaginal symptoms.  Will provide update to Dr. Sabra Heck and return call if any additional recommendations, patient agreeable.   Routing to provider for final review. Patient is agreeable to disposition. Will close encounter.

## 2019-11-18 NOTE — Progress Notes (Signed)
GYNECOLOGY  VISIT  CC:   Vaginal irritation/discharge  HPI: 64 y.o. G0P0000 Married White or Caucasian female here for possible vaginitis testing.  Pt has experienced significant vaginal atrophic changes with menopause and has pain with intercourse as well as vaginal discharge.  She is feeling more irritated at this time.  She's tried Vit E vaginal suppositories with minimal success, vagifem with no success, Estring with minimal success, Osphena that caused atypical neurological side effects and is now using vaginal estrogen cream pv twice weekly.  This has helped some and intercourse is not as painful.  She has experienced BV at times so is concerned this is occurring again.  Overall, discharge is better with vaginal estrogen cream use.  We have discussed HRT but pt had a neurological event in her 20's and has always been concerned about risks for strokes so does not want to use this.  We discussed vaginal laser treatment with [facility] as well as no FDA indication or head to head trial with traditional treatments.  She knows I feel like I am out of options with her.    Saw Dr. Brigitte Pulse 10/14/2019 for possible cystitis.  She had enterococcus present.  Was treated with macrobid 100mg  bid x 5 days.  Not really convinced she had a UTI as she did not have her typical symptoms of dysuria and urine odor change.   GYNECOLOGIC HISTORY: Patient's last menstrual period was 12/03/2010. Contraception: post menopausal Menopausal hormone therapy: none  Patient Active Problem List   Diagnosis Date Noted  . Atrophic vaginitis 02/03/2015  . Unspecified hypothyroidism 07/13/2013    Past Medical History:  Diagnosis Date  . Abnormal Pap smear of cervix    chemicl tx, cone bx, and laser  . Allergy   . Goiter    multinodular goiter--negative bx   . Migraine   . Thyroid disease    hypothyroidism    Past Surgical History:  Procedure Laterality Date  . BLEPHAROPLASTY  2012      . CERVIX LESION  DESTRUCTION  1990   chemical tx'd x2, cone bx and laser  . COLONOSCOPY      MEDS:   Current Outpatient Medications on File Prior to Visit  Medication Sig Dispense Refill  . Ascorbic Acid (VITAMIN C PO) Take by mouth daily.    Marland Kitchen BIOTIN PO Take 5,000 mcg by mouth daily.     . Cholecalciferol (VITAMIN D PO) Take by mouth daily.    . Cranberry 1000 MG CAPS Take by mouth.    . Cyanocobalamin (VITAMIN B 12 PO) Take by mouth daily.    . fexofenadine (ALLEGRA) 180 MG tablet Take 180 mg by mouth as needed.    Marland Kitchen levothyroxine (SYNTHROID, LEVOTHROID) 125 MCG tablet Take 125 mcg by mouth daily before breakfast. One whole tablet 6 days a week and Once a week takes 1/2 pill    . magnesium 30 MG tablet Take 30 mg by mouth 2 (two) times daily.    . Multiple Vitamins-Minerals (MULTIVITAMIN PO) Take by mouth daily.    . NONFORMULARY OR COMPOUNDED ITEM Vitamin E vaginal suppositories 200u/ml.  One pv three times weekly. 36 each 3  . potassium bicarbonate (K-LYTE) 25 MEQ disintegrating tablet Take 25 mEq by mouth 2 (two) times daily.    Marland Kitchen PREBIOTIC PRODUCT PO Take by mouth.    . Probiotic Product (PROBIOTIC PO) Take by mouth daily.    . valACYclovir (VALTREX) 500 MG tablet TAKE 1 CAPLET BY MOUTH TWICE DAILY AS  NEEDED FOR OUTBREAK     Current Facility-Administered Medications on File Prior to Visit  Medication Dose Route Frequency Provider Last Rate Last Admin  . 0.9 %  sodium chloride infusion  500 mL Intravenous Once Ladene Artist, MD        ALLERGIES: Gilberto Better [ospemifene] and Other  Family History  Problem Relation Age of Onset  . Parkinson's disease Brother   . Colon cancer Paternal Uncle   . Colon cancer Paternal Uncle   . Colon polyps Neg Hx   . Esophageal cancer Neg Hx   . Rectal cancer Neg Hx   . Stomach cancer Neg Hx     SH:  Married, non smoker  Review of Systems  Constitutional: Negative.   HENT: Negative.   Eyes: Negative.   Respiratory: Negative.   Cardiovascular: Negative.    Gastrointestinal: Negative.   Endocrine: Negative.   Genitourinary:       Vaginal itching, irritation & discharge  Musculoskeletal: Negative.   Skin: Negative.   Allergic/Immunologic: Negative.   Neurological: Negative.   Hematological: Negative.   Psychiatric/Behavioral: Negative.     PHYSICAL EXAMINATION:    BP 104/64   Pulse 70   Resp 16   Wt 179 lb (81.2 kg)   LMP 12/03/2010   BMI 29.33 kg/m     General appearance: alert, cooperative and appears stated age Lymph:  no inguinal LAD noted  Pelvic: External genitalia:  no lesions              Urethra:  normal appearing urethra with no masses, tenderness or lesions              Bartholins and Skenes: normal                 Vagina: atrophic appearing vaginal tissue still noted, discharge is present but does not appear different compared to her typical discharge              Cervix: no lesions              Bimanual Exam:  Uterus:  normal size, contour, position, consistency, mobility, non-tender              Adnexa: no mass, fullness, tenderness  Chaperone, Olene Floss, CMA, was present for exam.  Assessment: Vaginal discharge that is likely still from atrophic vaginal changes   Plan: Nuswab for BV and yeast obtained.  We reviewed all the products we have tried and what is left.  She feels there is some improvement with the vaginal estrogen cream so she is going to continue this, 1 gram pv twice weekly and not more frequently.  Does not need RF.   25 minutes of total time was spent for this patient encounter, including preparation, face-to-face counseling with the patient and coordination of care, and documentation of the encounter.

## 2019-11-18 NOTE — Telephone Encounter (Signed)
Patient is calling in regards to questions about upcoming office visit.

## 2019-11-22 ENCOUNTER — Encounter: Payer: Self-pay | Admitting: Obstetrics & Gynecology

## 2019-11-22 ENCOUNTER — Other Ambulatory Visit: Payer: Self-pay

## 2019-11-22 ENCOUNTER — Ambulatory Visit (INDEPENDENT_AMBULATORY_CARE_PROVIDER_SITE_OTHER): Payer: BC Managed Care – PPO | Admitting: Obstetrics & Gynecology

## 2019-11-22 VITALS — BP 104/64 | HR 70 | Resp 16 | Wt 179.0 lb

## 2019-11-22 DIAGNOSIS — N898 Other specified noninflammatory disorders of vagina: Secondary | ICD-10-CM

## 2019-11-22 DIAGNOSIS — N952 Postmenopausal atrophic vaginitis: Secondary | ICD-10-CM | POA: Diagnosis not present

## 2019-11-22 MED ORDER — ESTRADIOL 0.1 MG/GM VA CREA
TOPICAL_CREAM | VAGINAL | 1 refills | Status: DC
Start: 2019-11-22 — End: 2020-08-04

## 2019-11-23 ENCOUNTER — Other Ambulatory Visit: Payer: Self-pay | Admitting: Obstetrics & Gynecology

## 2019-11-23 DIAGNOSIS — Z1231 Encounter for screening mammogram for malignant neoplasm of breast: Secondary | ICD-10-CM

## 2019-11-24 ENCOUNTER — Encounter: Payer: Self-pay | Admitting: Obstetrics & Gynecology

## 2019-11-24 LAB — NUSWAB BV AND CANDIDA, NAA
Candida albicans, NAA: NEGATIVE
Candida glabrata, NAA: NEGATIVE

## 2019-12-07 ENCOUNTER — Ambulatory Visit
Admission: RE | Admit: 2019-12-07 | Discharge: 2019-12-07 | Disposition: A | Discharge status: Home / Self Care | Payer: BC Managed Care – PPO | Source: Ambulatory Visit

## 2019-12-07 ENCOUNTER — Other Ambulatory Visit: Payer: Self-pay

## 2019-12-07 DIAGNOSIS — Z1231 Encounter for screening mammogram for malignant neoplasm of breast: Secondary | ICD-10-CM | POA: Diagnosis not present

## 2019-12-24 ENCOUNTER — Encounter: Payer: Self-pay | Admitting: Obstetrics & Gynecology

## 2019-12-27 ENCOUNTER — Telehealth: Payer: Self-pay | Admitting: *Deleted

## 2019-12-27 NOTE — Telephone Encounter (Signed)
Olivia Ellison "Olivia Ellison"  P [facility] Hi Dr Sabra Heck, I would like to pursue the [facility] treatment. I started having the twitching above my eye again so I have quit using the cream. Would like to obtain information for how long the treatment would be and how long apart the appts are. Do you recommend where I go or do a referral? Thank you. Olivia Ellison       Dr. Sabra Heck -please advise on referral/recommendations.

## 2019-12-27 NOTE — Telephone Encounter (Signed)
See 12/27/19 telephone encounter.   Encounter closed.

## 2019-12-29 DIAGNOSIS — Z23 Encounter for immunization: Secondary | ICD-10-CM | POA: Diagnosis not present

## 2019-12-29 NOTE — Telephone Encounter (Signed)
I would refer her to Dian Queen, MD, at [facility] for [facility] touch.  The procedure is typically less than 5 minutes per procedure.  Most women need 3 treatments at first each about a month apart.  Most people report it stings when it's being done but not actually painful.  Pt lives between here and [place] and may be headed to [place] for the winter soon.  [facility] has a website with providers on it as well if she wants to have this done in [place].

## 2019-12-29 NOTE — Telephone Encounter (Signed)
Spoke with patient, advised as seen below per Dr. Sabra Heck. Patient will think about recommendations, will return call to office if referral needed. Patient thankful for call.   Encounter closed.

## 2020-01-24 DIAGNOSIS — M5451 Vertebrogenic low back pain: Secondary | ICD-10-CM | POA: Diagnosis not present

## 2020-01-24 DIAGNOSIS — M546 Pain in thoracic spine: Secondary | ICD-10-CM | POA: Diagnosis not present

## 2020-01-24 DIAGNOSIS — G5712 Meralgia paresthetica, left lower limb: Secondary | ICD-10-CM | POA: Diagnosis not present

## 2020-01-24 DIAGNOSIS — M62830 Muscle spasm of back: Secondary | ICD-10-CM | POA: Diagnosis not present

## 2020-01-25 DIAGNOSIS — R221 Localized swelling, mass and lump, neck: Secondary | ICD-10-CM | POA: Diagnosis not present

## 2020-03-07 DIAGNOSIS — E041 Nontoxic single thyroid nodule: Secondary | ICD-10-CM | POA: Diagnosis not present

## 2020-03-17 DIAGNOSIS — N941 Unspecified dyspareunia: Secondary | ICD-10-CM | POA: Diagnosis not present

## 2020-03-27 DIAGNOSIS — E042 Nontoxic multinodular goiter: Secondary | ICD-10-CM | POA: Diagnosis not present

## 2020-04-04 DIAGNOSIS — N3001 Acute cystitis with hematuria: Secondary | ICD-10-CM | POA: Diagnosis not present

## 2020-04-06 DIAGNOSIS — E042 Nontoxic multinodular goiter: Secondary | ICD-10-CM | POA: Diagnosis not present

## 2020-04-06 DIAGNOSIS — R221 Localized swelling, mass and lump, neck: Secondary | ICD-10-CM | POA: Diagnosis not present

## 2020-04-06 DIAGNOSIS — R590 Localized enlarged lymph nodes: Secondary | ICD-10-CM | POA: Diagnosis not present

## 2020-04-06 DIAGNOSIS — K118 Other diseases of salivary glands: Secondary | ICD-10-CM | POA: Diagnosis not present

## 2020-04-20 DIAGNOSIS — N390 Urinary tract infection, site not specified: Secondary | ICD-10-CM | POA: Diagnosis not present

## 2020-04-20 DIAGNOSIS — M720 Palmar fascial fibromatosis [Dupuytren]: Secondary | ICD-10-CM | POA: Diagnosis not present

## 2020-04-20 DIAGNOSIS — E042 Nontoxic multinodular goiter: Secondary | ICD-10-CM | POA: Diagnosis not present

## 2020-04-20 DIAGNOSIS — E039 Hypothyroidism, unspecified: Secondary | ICD-10-CM | POA: Diagnosis not present

## 2020-06-26 DIAGNOSIS — J029 Acute pharyngitis, unspecified: Secondary | ICD-10-CM | POA: Diagnosis not present

## 2020-06-26 DIAGNOSIS — R0981 Nasal congestion: Secondary | ICD-10-CM | POA: Diagnosis not present

## 2020-06-27 DIAGNOSIS — Z20822 Contact with and (suspected) exposure to covid-19: Secondary | ICD-10-CM | POA: Diagnosis not present

## 2020-07-04 ENCOUNTER — Ambulatory Visit: Payer: BC Managed Care – PPO

## 2020-08-02 ENCOUNTER — Ambulatory Visit (HOSPITAL_BASED_OUTPATIENT_CLINIC_OR_DEPARTMENT_OTHER): Payer: BC Managed Care – PPO | Admitting: Obstetrics & Gynecology

## 2020-08-03 ENCOUNTER — Ambulatory Visit (HOSPITAL_BASED_OUTPATIENT_CLINIC_OR_DEPARTMENT_OTHER): Payer: BC Managed Care – PPO | Admitting: Obstetrics & Gynecology

## 2020-08-04 ENCOUNTER — Other Ambulatory Visit: Payer: Self-pay

## 2020-08-04 ENCOUNTER — Other Ambulatory Visit (HOSPITAL_COMMUNITY)
Admission: RE | Admit: 2020-08-04 | Discharge: 2020-08-04 | Disposition: A | Discharge status: Home / Self Care | Payer: BC Managed Care – PPO | Source: Ambulatory Visit | Attending: Obstetrics & Gynecology | Admitting: Obstetrics & Gynecology

## 2020-08-04 ENCOUNTER — Encounter (HOSPITAL_BASED_OUTPATIENT_CLINIC_OR_DEPARTMENT_OTHER): Payer: Self-pay | Admitting: Obstetrics & Gynecology

## 2020-08-04 ENCOUNTER — Ambulatory Visit (INDEPENDENT_AMBULATORY_CARE_PROVIDER_SITE_OTHER): Payer: BC Managed Care – PPO | Admitting: Obstetrics & Gynecology

## 2020-08-04 ENCOUNTER — Encounter (HOSPITAL_BASED_OUTPATIENT_CLINIC_OR_DEPARTMENT_OTHER): Payer: Self-pay

## 2020-08-04 VITALS — BP 122/88 | HR 57 | Ht 65.5 in | Wt 180.0 lb

## 2020-08-04 DIAGNOSIS — Z124 Encounter for screening for malignant neoplasm of cervix: Secondary | ICD-10-CM

## 2020-08-04 DIAGNOSIS — G43109 Migraine with aura, not intractable, without status migrainosus: Secondary | ICD-10-CM | POA: Insufficient documentation

## 2020-08-04 DIAGNOSIS — B009 Herpesviral infection, unspecified: Secondary | ICD-10-CM | POA: Insufficient documentation

## 2020-08-04 DIAGNOSIS — G43009 Migraine without aura, not intractable, without status migrainosus: Secondary | ICD-10-CM

## 2020-08-04 DIAGNOSIS — Z780 Asymptomatic menopausal state: Secondary | ICD-10-CM | POA: Diagnosis not present

## 2020-08-04 DIAGNOSIS — Z01419 Encounter for gynecological examination (general) (routine) without abnormal findings: Secondary | ICD-10-CM | POA: Diagnosis not present

## 2020-08-04 DIAGNOSIS — Z800 Family history of malignant neoplasm of digestive organs: Secondary | ICD-10-CM

## 2020-08-04 DIAGNOSIS — E039 Hypothyroidism, unspecified: Secondary | ICD-10-CM | POA: Insufficient documentation

## 2020-08-04 NOTE — Progress Notes (Signed)
65 y.o. G0P0000 Married White or Caucasian female here for annual exam.  Did have [place] in [place].  Will have follow up in 1 year.  Feels this did help.  Denies vaginal bleeding.  Did have a UTI after the first treatment.  Had two migraines in March which hasn't occurred for several years.  Reports having lump in neck earlier this year.  Ultrasound was ordered.  Cyst was seen.  MRI was performed.  Pt reports this was negative.    Patient's last menstrual period was 12/03/2010.          Sexually active: Yes.    The current method of family planning is post menopausal status.    Exercising: Yes.    walking Smoker:  no  Health Maintenance: Pap:  09/13/2016 negative History of abnormal Pap:  Yes, remote hx (was over 20 year ago) MMG:  12/09/2019 negative Colonoscopy:  09/14/2018, follow up 5 years BMD:   2018 normal TDaP:  01/17/2019 Pneumonia vaccine(s):  Not indicated until after birthday Shingrix:   completed Hep C testing: 08/10/2015 Screening Labs: Dr. Brigitte Pulse, has appt at end of June/early July   reports that she has never smoked. She has never used smokeless tobacco. She reports current alcohol use of about 2.0 - 3.0 standard drinks of alcohol per week. She reports that she does not use drugs.  Past Medical History:  Diagnosis Date  . Abnormal Pap smear of cervix    chemicl tx, cone bx, and laser, >20+ years ago  . Allergy   . Goiter    multinodular goiter--negative bx   . Migraine   . Thyroid disease    hypothyroidism    Past Surgical History:  Procedure Laterality Date  . BLEPHAROPLASTY  2012      . CERVIX LESION DESTRUCTION  1990   chemical tx'd x2, cone bx and laser  . COLONOSCOPY      Current Outpatient Medications  Medication Sig Dispense Refill  . BIOTIN PO Take 5,000 mcg by mouth daily.     . Cholecalciferol (VITAMIN D PO) Take by mouth daily.    . Cranberry 1000 MG CAPS Take by mouth.    . Cyanocobalamin (VITAMIN B 12 PO) Take by mouth  daily.    . fexofenadine (ALLEGRA) 180 MG tablet Take 180 mg by mouth as needed.    Marland Kitchen levothyroxine (SYNTHROID, LEVOTHROID) 125 MCG tablet Take 125 mcg by mouth daily before breakfast. One whole tablet 6 days a week and Once a week takes 1/2 pill    . magnesium 30 MG tablet Take 30 mg by mouth 2 (two) times daily.    . Multiple Vitamins-Minerals (MULTIVITAMIN PO) Take by mouth daily.    . potassium bicarbonate (K-LYTE) 25 MEQ disintegrating tablet Take 25 mEq by mouth 2 (two) times daily.    Marland Kitchen PREBIOTIC PRODUCT PO Take by mouth.    . Probiotic Product (PROBIOTIC PO) Take by mouth daily.    . valACYclovir (VALTREX) 500 MG tablet TAKE 1 CAPLET BY MOUTH TWICE DAILY AS NEEDED FOR OUTBREAK    . Ascorbic Acid (VITAMIN C PO) Take by mouth daily. (Patient not taking: Reported on 08/04/2020)     Current Facility-Administered Medications  Medication Dose Route Frequency Provider Last Rate Last Admin  . 0.9 %  sodium chloride infusion  500 mL Intravenous Once Ladene Artist, MD        Family History  Problem Relation Age of Onset  . Parkinson's disease Brother   .  Colon cancer Paternal Uncle   . Colon cancer Paternal Uncle   . Colon polyps Neg Hx   . Esophageal cancer Neg Hx   . Rectal cancer Neg Hx   . Stomach cancer Neg Hx     Review of Systems  All other systems reviewed and are negative.   Exam:   BP 122/88 (BP Location: Right Arm, Patient Position: Sitting, Cuff Size: Small)   Pulse (!) 57   Ht 5' 5.5" (1.664 m)   Wt 180 lb (81.6 kg)   LMP 12/03/2010   BMI 29.50 kg/m   Height: 5' 5.5" (166.4 cm)  General appearance: alert, cooperative and appears stated age Head: Normocephalic, without obvious abnormality, atraumatic Neck: no adenopathy, supple, symmetrical, trachea midline and thyroid normal to inspection and palpation Lungs: clear to auscultation bilaterally Breasts: normal appearance, no masses or tenderness Heart: regular rate and rhythm Abdomen: soft, non-tender; bowel  sounds normal; no masses,  no organomegaly Extremities: extremities normal, atraumatic, no cyanosis or edema Skin: Skin color, texture, turgor normal. No rashes or lesions Lymph nodes: Cervical, supraclavicular, and axillary nodes normal. No abnormal inguinal nodes palpated Neurologic: Grossly normal   Pelvic: External genitalia:  no lesions              Urethra:  normal appearing urethra with no masses, tenderness or lesions              Bartholins and Skenes: normal                 Vagina: normal appearing vagina with normal color and no discharge, no lesions              Cervix: no lesions              Pap taken: Yes.   Bimanual Exam:  Uterus:  normal size, contour, position, consistency, mobility, non-tender              Adnexa: no mass, fullness, tenderness               Rectovaginal: Confirms               Anus:  normal sphincter tone, no lesions  Chaperone, Octaviano Batty, CMA, was present for exam.  Assessment/Plan: 1. Well woman exam with routine gynecological exam - pap with HR HPV obtained today - MMG 12/09/2019 - colonoscopy 09/2018, follow up 5 years - BMD 01/17/2019 - vaccines updated - lab work done with Dr. Brigitte Pulse  2. Postmenopausal - no HRT - h/o vaginal atrophy much improved after [facility] treatment in [place]  3. Atypical migraine - new migraines this year after many years without.  Will refer to neurology. - Ambulatory referral to Neurology  4. HSV infection - does not need valtrex rx but will call if does  5. Family history of colon cancer

## 2020-08-08 LAB — CYTOLOGY - PAP
Comment: NEGATIVE
Diagnosis: NEGATIVE
High risk HPV: NEGATIVE

## 2020-08-11 ENCOUNTER — Encounter (HOSPITAL_BASED_OUTPATIENT_CLINIC_OR_DEPARTMENT_OTHER): Payer: Self-pay | Admitting: Obstetrics & Gynecology

## 2020-08-23 DIAGNOSIS — L814 Other melanin hyperpigmentation: Secondary | ICD-10-CM | POA: Diagnosis not present

## 2020-08-23 DIAGNOSIS — L570 Actinic keratosis: Secondary | ICD-10-CM | POA: Diagnosis not present

## 2020-08-23 DIAGNOSIS — L821 Other seborrheic keratosis: Secondary | ICD-10-CM | POA: Diagnosis not present

## 2020-08-23 DIAGNOSIS — C4441 Basal cell carcinoma of skin of scalp and neck: Secondary | ICD-10-CM | POA: Diagnosis not present

## 2020-08-23 DIAGNOSIS — M79672 Pain in left foot: Secondary | ICD-10-CM | POA: Diagnosis not present

## 2020-08-23 DIAGNOSIS — M79671 Pain in right foot: Secondary | ICD-10-CM | POA: Diagnosis not present

## 2020-08-23 DIAGNOSIS — Z85828 Personal history of other malignant neoplasm of skin: Secondary | ICD-10-CM | POA: Diagnosis not present

## 2020-08-23 DIAGNOSIS — C44319 Basal cell carcinoma of skin of other parts of face: Secondary | ICD-10-CM | POA: Diagnosis not present

## 2020-08-23 DIAGNOSIS — C44519 Basal cell carcinoma of skin of other part of trunk: Secondary | ICD-10-CM | POA: Diagnosis not present

## 2020-08-23 DIAGNOSIS — D225 Melanocytic nevi of trunk: Secondary | ICD-10-CM | POA: Diagnosis not present

## 2020-08-30 DIAGNOSIS — H04123 Dry eye syndrome of bilateral lacrimal glands: Secondary | ICD-10-CM | POA: Diagnosis not present

## 2020-08-30 DIAGNOSIS — H2513 Age-related nuclear cataract, bilateral: Secondary | ICD-10-CM | POA: Diagnosis not present

## 2020-08-30 DIAGNOSIS — H10413 Chronic giant papillary conjunctivitis, bilateral: Secondary | ICD-10-CM | POA: Diagnosis not present

## 2020-08-30 DIAGNOSIS — H40013 Open angle with borderline findings, low risk, bilateral: Secondary | ICD-10-CM | POA: Diagnosis not present

## 2020-10-09 DIAGNOSIS — C44319 Basal cell carcinoma of skin of other parts of face: Secondary | ICD-10-CM | POA: Diagnosis not present

## 2020-10-23 ENCOUNTER — Other Ambulatory Visit: Payer: Self-pay | Admitting: Obstetrics & Gynecology

## 2020-10-23 DIAGNOSIS — Z1231 Encounter for screening mammogram for malignant neoplasm of breast: Secondary | ICD-10-CM

## 2020-11-01 ENCOUNTER — Ambulatory Visit: Payer: BC Managed Care – PPO | Admitting: Neurology

## 2020-12-14 ENCOUNTER — Ambulatory Visit: Payer: BC Managed Care – PPO | Admitting: Neurology

## 2020-12-14 ENCOUNTER — Encounter: Payer: Self-pay | Admitting: Neurology

## 2020-12-14 VITALS — BP 122/77 | HR 64 | Ht 66.0 in | Wt 180.0 lb

## 2020-12-14 DIAGNOSIS — R519 Headache, unspecified: Secondary | ICD-10-CM

## 2020-12-14 DIAGNOSIS — G4483 Primary cough headache: Secondary | ICD-10-CM | POA: Diagnosis not present

## 2020-12-14 DIAGNOSIS — R51 Headache with orthostatic component, not elsewhere classified: Secondary | ICD-10-CM | POA: Diagnosis not present

## 2020-12-14 DIAGNOSIS — G4484 Primary exertional headache: Secondary | ICD-10-CM

## 2020-12-14 DIAGNOSIS — G43109 Migraine with aura, not intractable, without status migrainosus: Secondary | ICD-10-CM | POA: Diagnosis not present

## 2020-12-14 DIAGNOSIS — H53453 Other localized visual field defect, bilateral: Secondary | ICD-10-CM

## 2020-12-14 MED ORDER — RIZATRIPTAN BENZOATE 10 MG PO TBDP: 10.0000 mg | ORAL_TABLET | ORAL | 11 refills | Status: AC | PRN

## 2020-12-14 NOTE — Patient Instructions (Signed)
MRI of the brain - will call One lab test  Rizatriptan:Please take one tablet at the onset of your headache. If it does not improve the symptoms please take one additional tablet. Do not take more then 2 tablets in 24hrs. Do not take use more then 2 to 3 times in a week.  Migraine Headache A migraine headache is an intense, throbbing pain on one side or both sides of the head. Migraine headaches may also cause other symptoms, such as nausea, vomiting, and sensitivity to light and noise. A migraine headache can last from 4 hours to 3 days. Talk with your doctor about what things may bring on (trigger) your migraine headaches. What are the causes? The exact cause of this condition is not known. However, a migraine may be caused when nerves in the brain become irritated and release chemicals that cause inflammation of blood vessels. This inflammation causes pain. This condition may be triggered or caused by: Drinking alcohol. Smoking. Taking medicines, such as: Medicine used to treat chest pain (nitroglycerin). Birth control pills. Estrogen. Certain blood pressure medicines. Eating or drinking products that contain nitrates, glutamate, aspartame, or tyramine. Aged cheeses, chocolate, or caffeine may also be triggers. Doing physical activity. Other things that may trigger a migraine headache include: Menstruation. Pregnancy. Hunger. Stress. Lack of sleep or too much sleep. Weather changes. Fatigue. What increases the risk? The following factors may make you more likely to experience migraine headaches: Being a certain age. This condition is more common in people who are 4-87 years old. Being female. Having a family history of migraine headaches. Being Caucasian. Having a mental health condition, such as depression or anxiety. Being obese. What are the signs or symptoms? The main symptom of this condition is pulsating or throbbing pain. This pain may: Happen in any area of the head,  such as on one side or both sides. Interfere with daily activities. Get worse with physical activity. Get worse with exposure to bright lights or loud noises. Other symptoms may include: Nausea. Vomiting. Dizziness. General sensitivity to bright lights, loud noises, or smells. Before you get a migraine headache, you may get warning signs (an aura). An aura may include: Seeing flashing lights or having blind spots. Seeing bright spots, halos, or zigzag lines. Having tunnel vision or blurred vision. Having numbness or a tingling feeling. Having trouble talking. Having muscle weakness. Some people have symptoms after a migraine headache (postdromal phase), such as: Feeling tired. Difficulty concentrating. How is this diagnosed? A migraine headache can be diagnosed based on: Your symptoms. A physical exam. Tests, such as: CT scan or an MRI of the head. These imaging tests can help rule out other causes of headaches. Taking fluid from the spine (lumbar puncture) and analyzing it (cerebrospinal fluid analysis, or CSF analysis). How is this treated? This condition may be treated with medicines that: Relieve pain. Relieve nausea. Prevent migraine headaches. Treatment for this condition may also include: Acupuncture. Lifestyle changes like avoiding foods that trigger migraine headaches. Biofeedback. Cognitive behavioral therapy. Follow these instructions at home: Medicines Take over-the-counter and prescription medicines only as told by your health care provider. Ask your health care provider if the medicine prescribed to you: Requires you to avoid driving or using heavy machinery. Can cause constipation. You may need to take these actions to prevent or treat constipation: Drink enough fluid to keep your urine pale yellow. Take over-the-counter or prescription medicines. Eat foods that are high in fiber, such as beans, whole grains, and fresh  fruits and vegetables. Limit foods  that are high in fat and processed sugars, such as fried or sweet foods. Lifestyle Do not drink alcohol. Do not use any products that contain nicotine or tobacco, such as cigarettes, e-cigarettes, and chewing tobacco. If you need help quitting, ask your health care provider. Get at least 8 hours of sleep every night. Find ways to manage stress, such as meditation, deep breathing, or yoga. General instructions   Keep a journal to find out what may trigger your migraine headaches. For example, write down: What you eat and drink. How much sleep you get. Any change to your diet or medicines. If you have a migraine headache: Avoid things that make your symptoms worse, such as bright lights. It may help to lie down in a dark, quiet room. Do not drive or use heavy machinery. Ask your health care provider what activities are safe for you while you are experiencing symptoms. Keep all follow-up visits as told by your health care provider. This is important. Contact a health care provider if: You develop symptoms that are different or more severe than your usual migraine headache symptoms. You have more than 15 headache days in one month. Get help right away if: Your migraine headache becomes severe. Your migraine headache lasts longer than 72 hours. You have a fever. You have a stiff neck. You have vision loss. Your muscles feel weak or like you cannot control them. You start to lose your balance often. You have trouble walking. You faint. You have a seizure. Summary A migraine headache is an intense, throbbing pain on one side or both sides of the head. Migraines may also cause other symptoms, such as nausea, vomiting, and sensitivity to light and noise. This condition may be treated with medicines and lifestyle changes. You may also need to avoid certain things that trigger a migraine headache. Keep a journal to find out what may trigger your migraine headaches. Contact your health care  provider if you have more than 15 headache days in a month or you develop symptoms that are different or more severe than your usual migraine headache symptoms. This information is not intended to replace advice given to you by your health care provider. Make sure you discuss any questions you have with your health care provider. Document Revised: 06/12/2018 Document Reviewed: 04/02/2018 Elsevier Patient Education  [place] Tablets What is this medication? RIZATRIPTAN (rye za TRIP tan) treats migraines. It works by blocking pain signals and narrowing blood vessels in the brain. It belongs to a group of medications called triptans. It is not used to prevent migraines. This medicine may be used for other purposes; ask your health care provider or pharmacist if you have questions. COMMON BRAND NAME(S): Maxalt-MLT What should I tell my care team before I take this medication? They need to know if you have any of these conditions: Cigarette smoker Circulation problems in fingers and toes Diabetes Heart disease High blood pressure High cholesterol History of irregular heartbeat History of stroke Kidney disease Liver disease Stomach or intestine problems An unusual or allergic reaction to rizatriptan, other medications, foods, dyes, or preservatives Pregnant or trying to get pregnant Breast-feeding How should I use this medication? Take this medication by mouth. Follow the directions on the prescription label. Leave the tablet in the sealed blister pack until you are ready to take it. With dry hands, open the blister and gently remove the tablet. If the tablet breaks or crumbles, throw it  away and take a new tablet out of the blister pack. Place the tablet in the mouth and allow it to dissolve, and then swallow. Do not cut, crush, or chew this medication. You do not need water to take this medication. Do not take it more often than directed. Talk to your care  team regarding the use of this medication in children. While this medication may be prescribed for children as young as 6 years for selected conditions, precautions do apply. Overdosage: If you think you have taken too much of this medicine contact a poison control center or emergency room at once. NOTE: This medicine is only for you. Do not share this medicine with others. What if I miss a dose? This does not apply. This medication is not for regular use. What may interact with this medication? Do not take this medication with any of the following medications: Certain medications for migraine headache like almotriptan, eletriptan, frovatriptan, naratriptan, rizatriptan, sumatriptan, zolmitriptan Ergot alkaloids like dihydroergotamine, ergonovine, ergotamine, methylergonovine MAOIs like Carbex, Eldepryl, Marplan, Nardil, and Parnate This medication may also interact with the following medications: Certain medications for depression, anxiety, or psychotic disorders Propranolol This list may not describe all possible interactions. Give your health care provider a list of all the medicines, herbs, non-prescription drugs, or dietary supplements you use. Also tell them if you smoke, drink alcohol, or use illegal drugs. Some items may interact with your medicine. What should I watch for while using this medication? Visit your care team for regular checks on your progress. Tell your care team if your symptoms do not start to get better or if they get worse. You may get drowsy or dizzy. Do not drive, use machinery, or do anything that needs mental alertness until you know how this medication affects you. Do not stand up or sit up quickly, especially if you are an older patient. This reduces the risk of dizzy or fainting spells. Alcohol may interfere with the effect of this medication. Your mouth may get dry. Chewing sugarless gum or sucking hard candy and drinking plenty of water may help. Contact your care  team if the problem does not go away or is severe. If you take migraine medications for 10 or more days a month, your migraines may get worse. Keep a diary of headache days and medication use. Contact your care team if your migraine attacks occur more frequently. What side effects may I notice from receiving this medication? Side effects that you should report to your care team as soon as possible: Allergic reactions-skin rash, itching, hives, swelling of the face, lips, tongue, or throat Burning, pain, tingling, or color changes in the legs or feet Heart attack-pain or tightness in the chest, shoulders, arms, or jaw, nausea, shortness of breath, cold or clammy skin, feeling faint or lightheaded Heart rhythm changes-fast or irregular heartbeat, dizziness, feeling faint or lightheaded, chest pain, trouble breathing Increase in blood pressure Irritability, confusion, fast or irregular heartbeat, muscle stiffness, twitching muscles, sweating, high fever, seizure, chills, vomiting, diarrhea, which may be signs of serotonin syndrome Raynaud's-cool, numb, or painful fingers or toes that may change color from pale, to blue, to red Seizures Stroke-sudden numbness or weakness of the face, arm, or leg, trouble speaking, confusion, trouble walking, loss of balance or coordination, dizziness, severe headache, change in vision Sudden or severe stomach pain, nausea, vomiting, fever, or bloody diarrhea Vision loss Side effects that usually do not require medical attention (report to your care team if they continue  or are bothersome): Dizziness General discomfort or fatigue This list may not describe all possible side effects. Call your doctor for medical advice about side effects. You may report side effects to FDA at 1-800-FDA-1088. Where should I keep my medication? Keep out of the reach of children and pets. Store at room temperature between 15 and 30 degrees C (59 and 86 degrees F). Protect from light and  moisture. Throw away any unused medication after the expiration date. NOTE: This sheet is a summary. It may not cover all possible information. If you have questions about this medicine, talk to your doctor, pharmacist, or health care provider.  2022 Elsevier/Gold Standard (2020-03-15 16:19:19)

## 2020-12-14 NOTE — Progress Notes (Signed)
[facility]    Provider:  Dr Jaynee Eagles Requesting Provider: Ginger Organ., MD Primary Care Provider:  Ginger Organ., MD  CC:  Headaches  HPI:  Olivia Ellison is a 65 y.o. female here as requested by Ginger Organ., MD for migraine. PMHx atypical migraine, hypothyroidism-goiter and thyroid disease.  I reviewed Dr. Ammie Ferrier notes which stated patient had 2 migraines in March which has not occurred for several years.  The first migraine was in 1982 she was working and out on a trip and she couldn't talk and see followed by a headache which is repeatable. Diagnosed with migraine/ atypical migraine. She used to get them infrequently, would have tunnel vision followed by headache and sleep would help, pulsating/thudding with eye pain in the back of the head, movement made it worse, light sensitivity. She hasn't had a migraine in 20 years and then in March of 2021 she drank some collagen in her coffee and had same-type migraine with tunnel vision followed same headache. On April 2nd of this year she was at a hair salon, she goes home sleep, if she sleeps it goes away, sleeping sooner helps, she has some residual pain in theback of the head which hurts moving neck or pressure or sneezing. In March of 2021 she couldn't talk or think like the first one. On August 29th she had another migraine, she sat on the couch and a smell trigered a migraine and again same, went to bed, can last 4-24 hours but residual "thudding" for a few days. Niece has migraines.   Reviewed notes, labs and imaging from outside physicians, which showed:  From a thorough review of records, medications tried that can be used in migraine management include Benadryl, magnesium,otc analgesics  Review of Systems: Patient complains of symptoms per HPI as well as the following symptoms headache. Pertinent negatives and positives per HPI. All others negative.   Social History   Socioeconomic History    Marital status: Married    Spouse name: Not on file   Number of children: Not on file   Years of education: Not on file   Highest education level: Not on file  Occupational History   Not on file  Tobacco Use   Smoking status: Never   Smokeless tobacco: Never  Vaping Use   Vaping Use: Never used  Substance and Sexual Activity   Alcohol use: Yes    Alcohol/week: 2.0 - 3.0 standard drinks    Types: 2 - 3 Standard drinks or equivalent per week   Drug use: No   Sexual activity: Yes    Partners: Male    Birth control/protection: Post-menopausal  Other Topics Concern   Not on file  Social History Narrative   Not on file   Social Determinants of Health   Financial Resource Strain: Not on file  Food Insecurity: Not on file  Transportation Needs: Not on file  Physical Activity: Not on file  Stress: Not on file  Social Connections: Not on file  Intimate Partner Violence: Not on file    Family History  Problem Relation Age of Onset   Parkinson's disease Brother    Colon cancer Paternal Uncle    Colon cancer Paternal Uncle    Colon polyps Neg Hx    Esophageal cancer Neg Hx    Rectal cancer Neg Hx    Stomach cancer Neg Hx     Past Medical History:  Diagnosis Date   Abnormal Pap smear of  cervix    chemicl tx, cone bx, and laser, >20+ years ago   Allergy    Atypical migraine    Goiter    multinodular goiter--negative bx    Thyroid disease    hypothyroidism    Patient Active Problem List   Diagnosis Date Noted   HSV infection 08/04/2020   Migraine with aura and without status migrainosus, not intractable 08/04/2020   Hypothyroidism 08/04/2020   Atrophic vaginitis 02/03/2015   Unspecified hypothyroidism 07/13/2013    Past Surgical History:  Procedure Laterality Date   BLEPHAROPLASTY  2012       CERVIX LESION DESTRUCTION  1990   chemical tx'd x2, cone bx and laser   COLONOSCOPY      Current Outpatient Medications  Medication Sig Dispense Refill   Calcium  Carbonate-Vit D-Min (CALCIUM 1200 PO) Take by mouth daily.     Krill Oil 350 MG CAPS Take by mouth daily.     rizatriptan (MAXALT-MLT) 10 MG disintegrating tablet Take 1 tablet (10 mg total) by mouth as needed for migraine. May repeat in 2 hours if needed 9 tablet 11   Zinc 50 MG CAPS Take by mouth daily.     BIOTIN PO Take 5,000 mcg by mouth daily.      Cholecalciferol (VITAMIN D PO) Take by mouth daily.     Cranberry 1000 MG CAPS Take by mouth.     Cyanocobalamin (VITAMIN B 12 PO) Take by mouth daily.     fexofenadine (ALLEGRA) 180 MG tablet Take 180 mg by mouth as needed.     levothyroxine (SYNTHROID, LEVOTHROID) 125 MCG tablet Take 125 mcg by mouth daily before breakfast. One whole tablet 6 days a week and Once a week takes 1/2 pill     magnesium 30 MG tablet Take 30 mg by mouth 2 (two) times daily.     Multiple Vitamins-Minerals (MULTIVITAMIN PO) Take by mouth daily.     Probiotic Product (PROBIOTIC PO) Take by mouth daily.     valACYclovir (VALTREX) 500 MG tablet TAKE 1 CAPLET BY MOUTH TWICE DAILY AS NEEDED FOR OUTBREAK     Current Facility-Administered Medications  Medication Dose Route Frequency Provider Last Rate Last Admin   0.9 %  sodium chloride infusion  500 mL Intravenous Once Ladene Artist, MD        Allergies as of 12/14/2020 - Review Complete 12/14/2020  Allergen Reaction Noted   Osphena [ospemifene] Other (See Comments) 02/01/2014   Other Other (See Comments) 10/02/2012    Vitals: BP 122/77   Pulse 64   Ht 5\' 6"  (1.676 m)   Wt 180 lb (81.6 kg)   LMP 12/03/2010   BMI 29.05 kg/m  Last Weight:  Wt Readings from Last 1 Encounters:  12/14/20 180 lb (81.6 kg)   Last Height:   Ht Readings from Last 1 Encounters:  12/14/20 5\' 6"  (1.676 m)     Physical exam: Exam: Gen: NAD, conversant, well nourised,  well groomed                     CV: RRR, no MRG. No Carotid Bruits. No peripheral edema, warm, nontender Eyes: Conjunctivae clear without exudates or  hemorrhage  Neuro: Detailed Neurologic Exam  Speech:    Speech is normal; fluent and spontaneous with normal comprehension.  Cognition:    The patient is oriented to person, place, and time;     recent and remote memory intact;     language fluent;  normal attention, concentration,     fund of knowledge Cranial Nerves:    The pupils are equal, round, and reactive to light. The fundi are flat Visual fields are full to finger confrontation. Extraocular movements are intact. Trigeminal sensation is intact and the muscles of mastication are normal. The face is symmetric. The palate elevates in the midline. Hearing intact. Voice is normal. Shoulder shrug is normal. The tongue has normal motion without fasciculations.   Coordination:    Normal finger to nose  Gait:    normal.   Motor Observation:    No asymmetry, no atrophy, and no involuntary movements noted. Tone:    Normal muscle tone.    Posture:    Posture is normal. normal erect    Strength:    Strength is V/V in the upper and lower limbs.      Sensation: intact to LT     Reflex Exam:  DTR's:    Deep tendon reflexes in the upper and lower extremities are normal bilaterally.   Toes:    The toes are downgoing bilaterally.   Clonus:    Clonus is absent.    Assessment/Plan:  Patient with likely migraine with aura but given concerning symptoms recommend MRI brain  MRI brain due to concerning symptoms of positional headaches worse with coughing,vision changes  to look for space occupying mass, chiari or intracranial hypertension (pseudotumor), strokes, demyelination(MS). Discussed increased risk of stroke in migraine with aura, no estrogen or HRT, closely manage other risk factors.    Orders Placed This Encounter  Procedures   MR BRAIN W WO CONTRAST   Basic Metabolic Panel    Meds ordered this encounter  Medications   rizatriptan (MAXALT-MLT) 10 MG disintegrating tablet    Sig: Take 1 tablet (10 mg total) by  mouth as needed for migraine. May repeat in 2 hours if needed    Dispense:  9 tablet    Refill:  11   Discussed: To prevent or relieve headaches, try the following: Cool Compress. Lie down and place a cool compress on your head.  Avoid headache triggers. If certain foods or odors seem to have triggered your migraines in the past, avoid them. A headache diary might help you identify triggers.  Include physical activity in your daily routine. Try a daily walk or other moderate aerobic exercise.  Manage stress. Find healthy ways to cope with the stressors, such as delegating tasks on your to-do list.  Practice relaxation techniques. Try deep breathing, yoga, massage and visualization.  Eat regularly. Eating regularly scheduled meals and maintaining a healthy diet might help prevent headaches. Also, drink plenty of fluids.  Follow a regular sleep schedule. Sleep deprivation might contribute to headaches Consider biofeedback. With this mind-body technique, you learn to control certain bodily functions -- such as muscle tension, heart rate and blood pressure -- to prevent headaches or reduce headache pain.    Proceed to emergency room if you experience new or worsening symptoms or symptoms do not resolve, if you have new neurologic symptoms or if headache is severe, or for any concerning symptom.   Provided education and documentation from American headache Society toolbox including articles on: chronic migraine medication overuse headache, chronic migraines, prevention of migraines, behavioral and other nonpharmacologic treatments for headache.   Cc: Ginger Organ., MD,  Ginger Organ., MD  Sarina Ill, MD  [facility] [address] [place], [place] [address]  Phone [phone] Fax [phone]

## 2020-12-15 LAB — BASIC METABOLIC PANEL
BUN/Creatinine Ratio: 18 (ref 12–28)
BUN: 17 mg/dL (ref 8–27)
CO2: 24 mmol/L (ref 20–29)
Calcium: 9.5 mg/dL (ref 8.7–10.3)
Chloride: 105 mmol/L (ref 96–106)
Creatinine, Ser: 0.93 mg/dL (ref 0.57–1.00)
Glucose: 94 mg/dL (ref 70–99)
Potassium: 4.7 mmol/L (ref 3.5–5.2)
Sodium: 142 mmol/L (ref 134–144)
eGFR: 69 mL/min/{1.73_m2} (ref >59)

## 2020-12-18 ENCOUNTER — Telehealth: Payer: Self-pay | Admitting: Neurology

## 2020-12-18 DIAGNOSIS — M8589 Other specified disorders of bone density and structure, multiple sites: Secondary | ICD-10-CM | POA: Diagnosis not present

## 2020-12-18 DIAGNOSIS — M859 Disorder of bone density and structure, unspecified: Secondary | ICD-10-CM | POA: Diagnosis not present

## 2020-12-18 DIAGNOSIS — E039 Hypothyroidism, unspecified: Secondary | ICD-10-CM | POA: Diagnosis not present

## 2020-12-18 DIAGNOSIS — R7301 Impaired fasting glucose: Secondary | ICD-10-CM | POA: Diagnosis not present

## 2020-12-18 DIAGNOSIS — E785 Hyperlipidemia, unspecified: Secondary | ICD-10-CM | POA: Diagnosis not present

## 2020-12-18 NOTE — Telephone Encounter (Signed)
spoke to the patient due to the cost she is going to hold off  [employer] auth: [id number] (exp. 12/18/20 to 01/16/21)

## 2020-12-19 DIAGNOSIS — R030 Elevated blood-pressure reading, without diagnosis of hypertension: Secondary | ICD-10-CM | POA: Diagnosis not present

## 2020-12-19 DIAGNOSIS — R82998 Other abnormal findings in urine: Secondary | ICD-10-CM | POA: Diagnosis not present

## 2020-12-19 DIAGNOSIS — Z0000 Encounter for general adult medical examination without abnormal findings: Secondary | ICD-10-CM | POA: Diagnosis not present

## 2020-12-19 DIAGNOSIS — Z1389 Encounter for screening for other disorder: Secondary | ICD-10-CM | POA: Diagnosis not present

## 2020-12-19 DIAGNOSIS — Z1331 Encounter for screening for depression: Secondary | ICD-10-CM | POA: Diagnosis not present

## 2020-12-25 ENCOUNTER — Ambulatory Visit: Payer: BC Managed Care – PPO

## 2021-02-07 IMAGING — MG DIGITAL SCREENING BILATERAL MAMMOGRAM WITH CAD
4 series · 4 of 4 positions shown · non-contrast
Comparison: Previous exam(s).

CLINICAL DATA: Screening.

EXAM:
DIGITAL SCREENING BILATERAL MAMMOGRAM WITH CAD

[L CC]
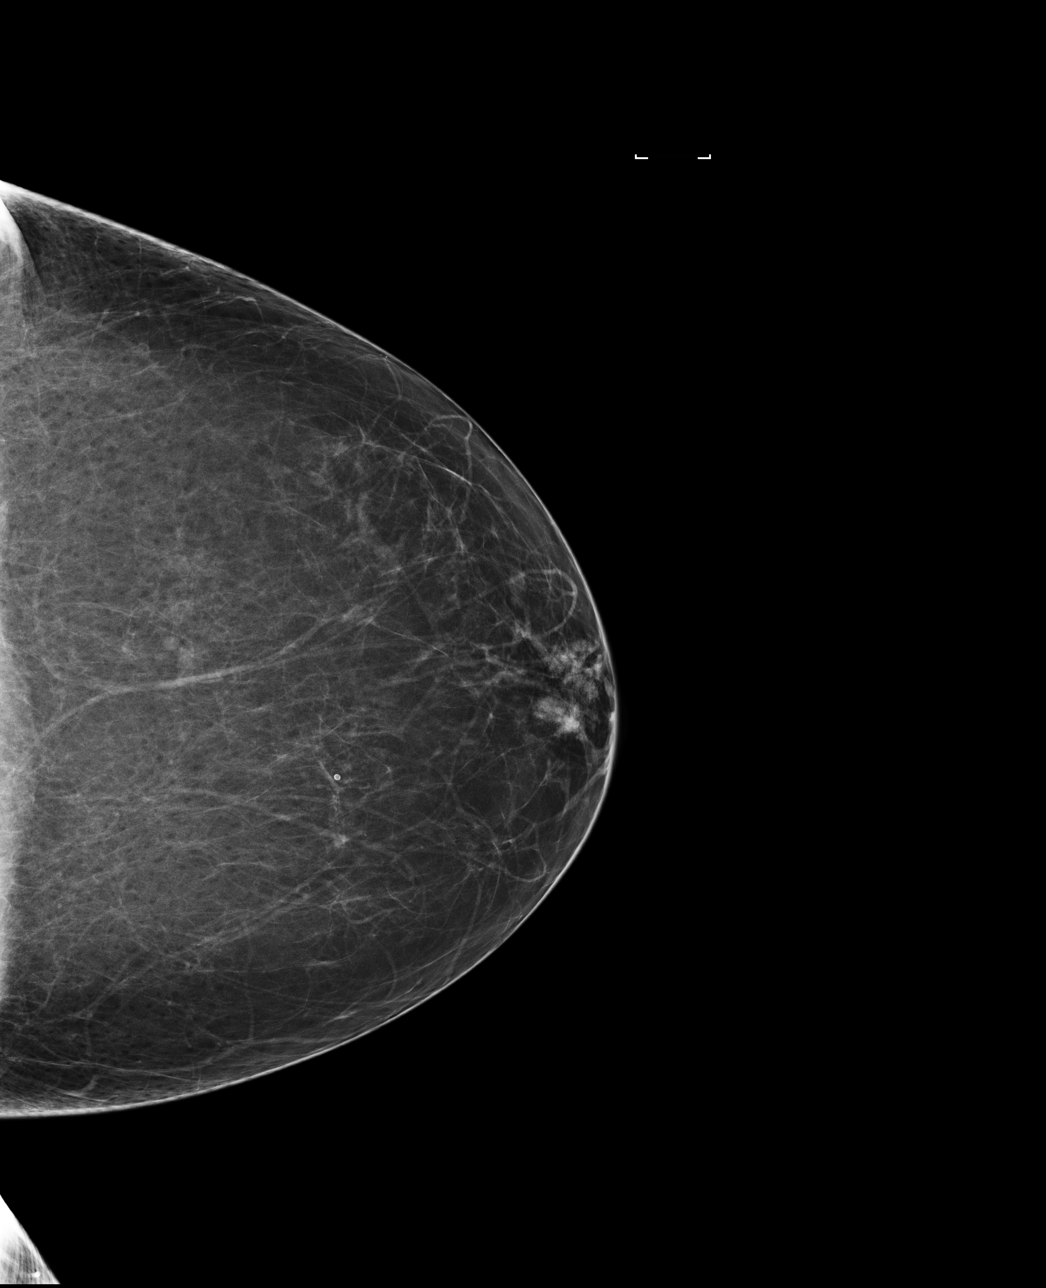

[L MLO]
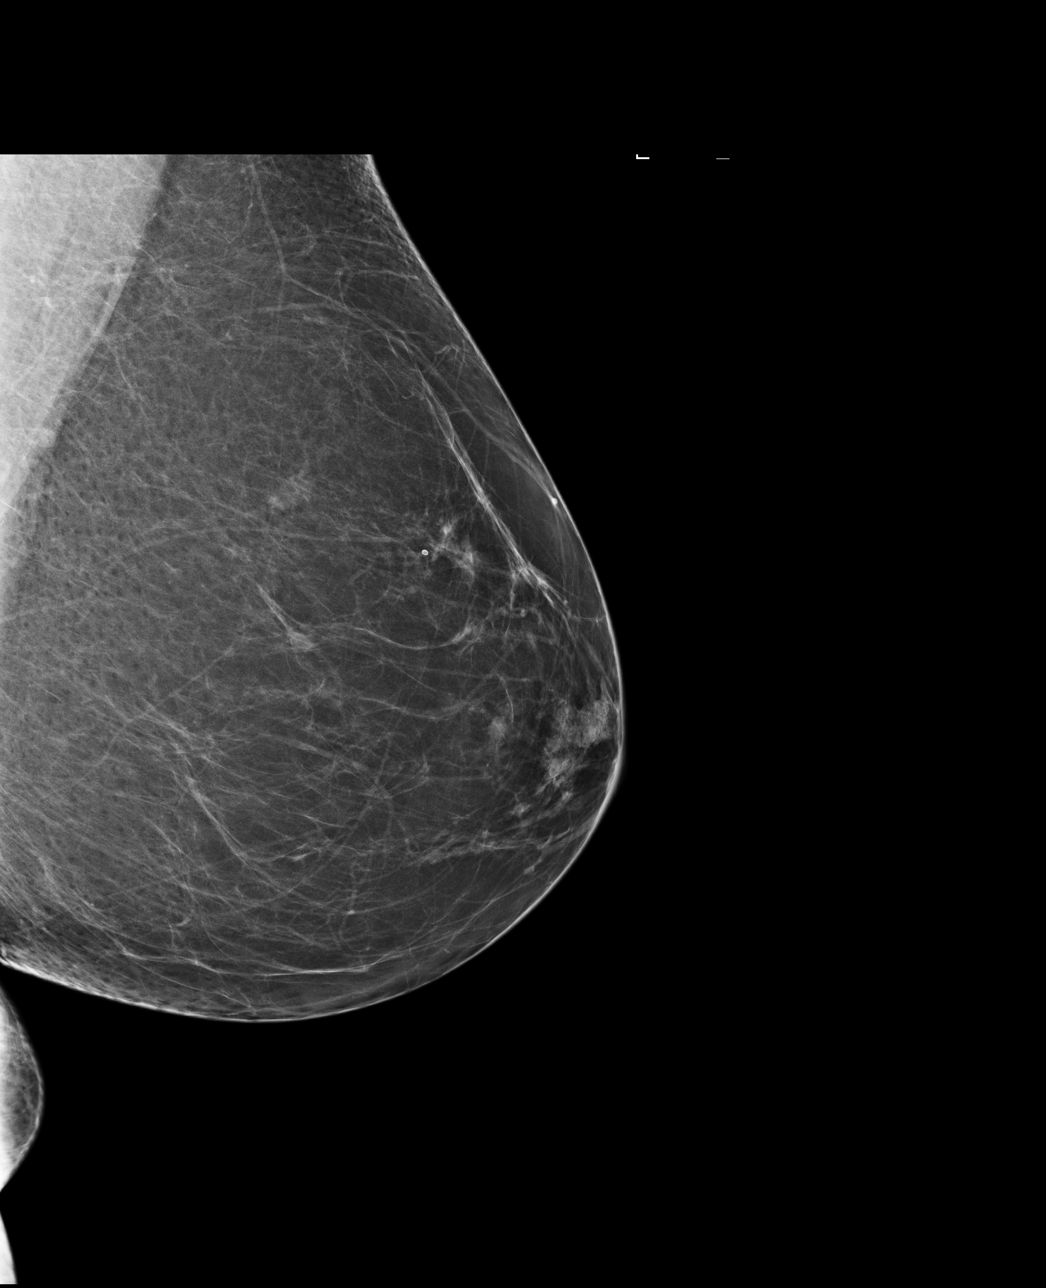

[R CC]
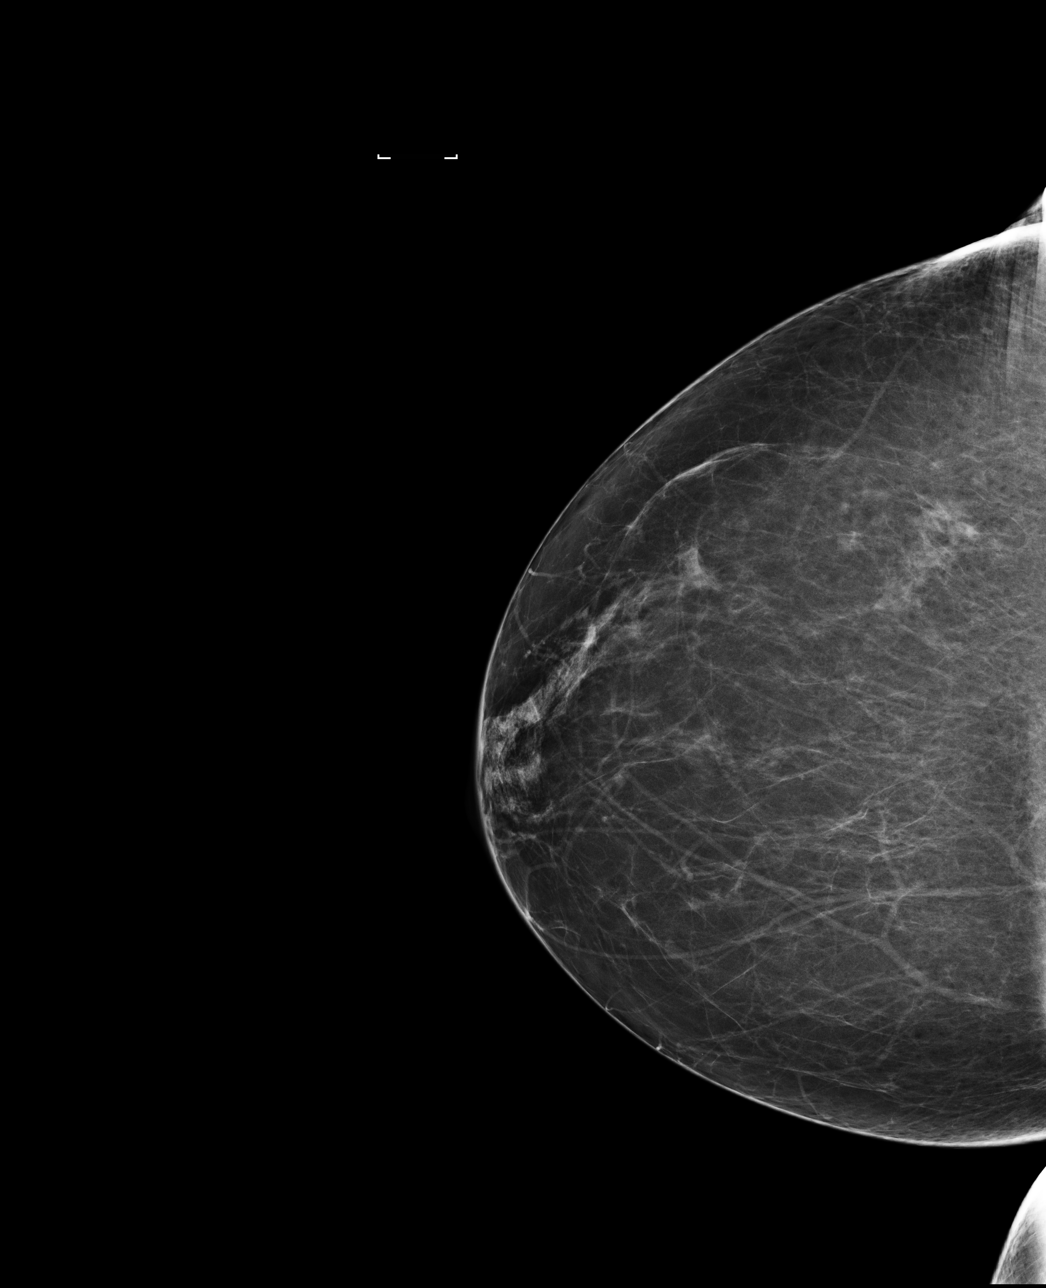

[R MLO]
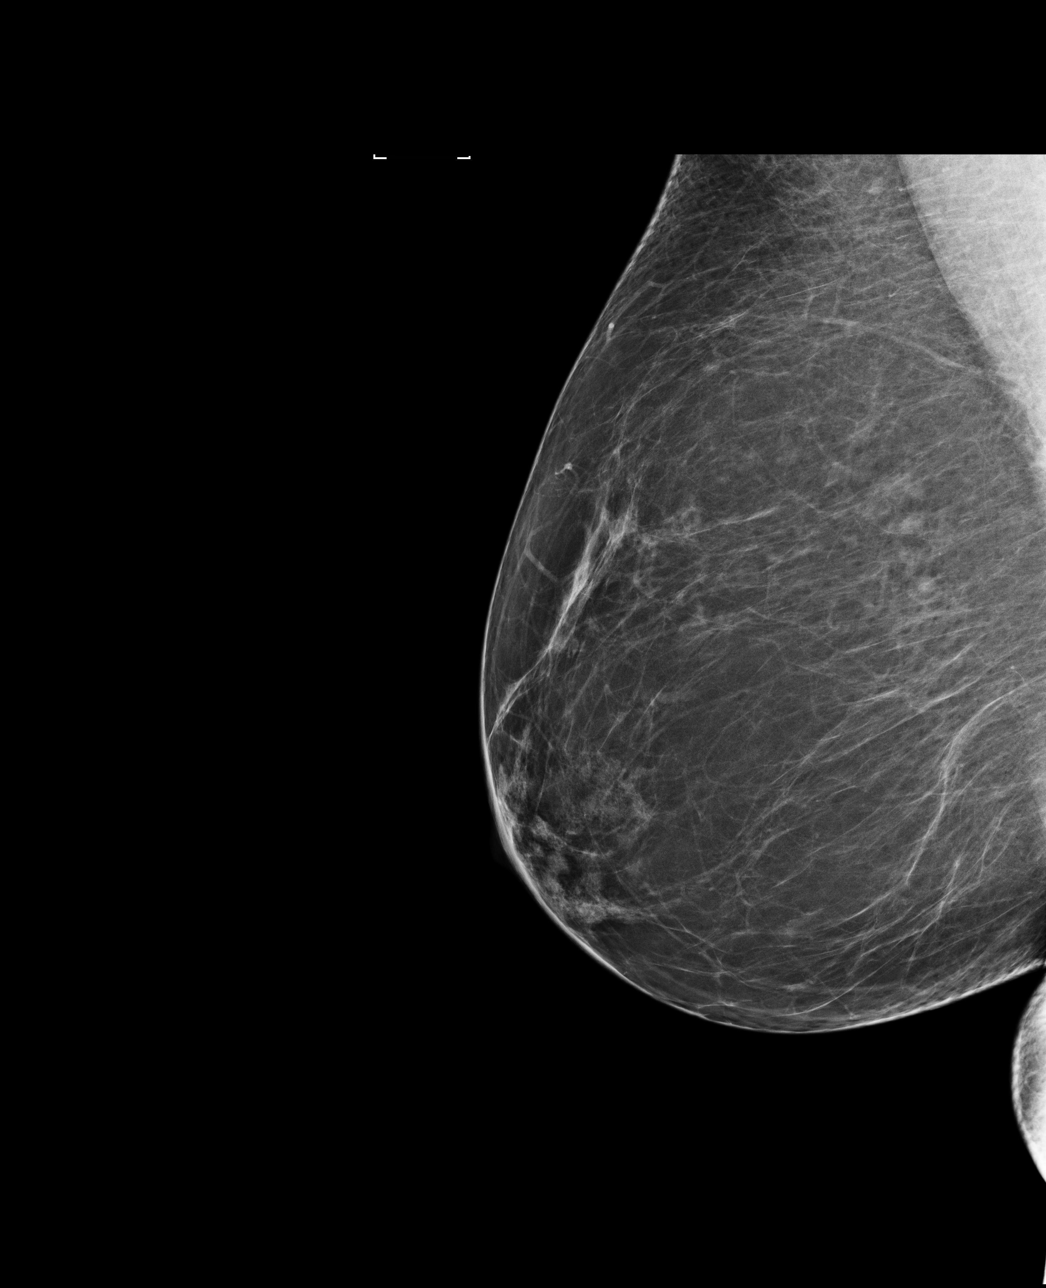

[4 of 4 positions shown; findings below may reference images not displayed]

ACR Breast Density Category b: There are scattered areas of
fibroglandular density.
FINDINGS: There are no findings suspicious for malignancy. Images were
processed with CAD.
IMPRESSION: No mammographic evidence of malignancy. A result letter of this
screening mammogram will be mailed directly to the patient.

RECOMMENDATION:
Screening mammogram in one year. (Code:AS-G-LCT)

BI-RADS CATEGORY  1: Negative.

## 2021-02-14 DIAGNOSIS — M62830 Muscle spasm of back: Secondary | ICD-10-CM | POA: Diagnosis not present

## 2021-02-14 DIAGNOSIS — M7632 Iliotibial band syndrome, left leg: Secondary | ICD-10-CM | POA: Diagnosis not present

## 2021-02-14 DIAGNOSIS — M9903 Segmental and somatic dysfunction of lumbar region: Secondary | ICD-10-CM | POA: Diagnosis not present

## 2021-02-14 DIAGNOSIS — M9904 Segmental and somatic dysfunction of sacral region: Secondary | ICD-10-CM | POA: Diagnosis not present

## 2021-02-14 DIAGNOSIS — M9902 Segmental and somatic dysfunction of thoracic region: Secondary | ICD-10-CM | POA: Diagnosis not present

## 2021-02-14 DIAGNOSIS — M546 Pain in thoracic spine: Secondary | ICD-10-CM | POA: Diagnosis not present

## 2021-02-14 DIAGNOSIS — M62452 Contracture of muscle, left thigh: Secondary | ICD-10-CM | POA: Diagnosis not present

## 2021-03-20 ENCOUNTER — Ambulatory Visit
Admission: RE | Admit: 2021-03-20 | Discharge: 2021-03-20 | Disposition: A | Discharge status: Home / Self Care | Payer: BC Managed Care – PPO | Source: Ambulatory Visit | Attending: Obstetrics & Gynecology | Admitting: Obstetrics & Gynecology

## 2021-03-20 DIAGNOSIS — Z1231 Encounter for screening mammogram for malignant neoplasm of breast: Secondary | ICD-10-CM | POA: Diagnosis not present

## 2021-03-21 ENCOUNTER — Ambulatory Visit: Payer: BC Managed Care – PPO

## 2021-03-21 DIAGNOSIS — L570 Actinic keratosis: Secondary | ICD-10-CM | POA: Diagnosis not present

## 2021-03-21 DIAGNOSIS — D225 Melanocytic nevi of trunk: Secondary | ICD-10-CM | POA: Diagnosis not present

## 2021-03-21 DIAGNOSIS — L821 Other seborrheic keratosis: Secondary | ICD-10-CM | POA: Diagnosis not present

## 2021-03-21 DIAGNOSIS — Z85828 Personal history of other malignant neoplasm of skin: Secondary | ICD-10-CM | POA: Diagnosis not present

## 2021-05-04 DIAGNOSIS — E039 Hypothyroidism, unspecified: Secondary | ICD-10-CM | POA: Diagnosis not present

## 2021-06-04 DIAGNOSIS — E042 Nontoxic multinodular goiter: Secondary | ICD-10-CM | POA: Diagnosis not present

## 2021-06-07 ENCOUNTER — Encounter (HOSPITAL_BASED_OUTPATIENT_CLINIC_OR_DEPARTMENT_OTHER): Payer: Self-pay | Admitting: *Deleted

## 2021-06-07 ENCOUNTER — Encounter (HOSPITAL_BASED_OUTPATIENT_CLINIC_OR_DEPARTMENT_OTHER): Payer: Self-pay | Admitting: Obstetrics & Gynecology

## 2021-06-08 ENCOUNTER — Encounter (HOSPITAL_BASED_OUTPATIENT_CLINIC_OR_DEPARTMENT_OTHER): Payer: Self-pay | Admitting: Obstetrics & Gynecology

## 2021-06-26 ENCOUNTER — Telehealth: Payer: Self-pay | Admitting: Neurology

## 2021-06-26 DIAGNOSIS — R253 Fasciculation: Secondary | ICD-10-CM | POA: Diagnosis not present

## 2021-06-26 DIAGNOSIS — E041 Nontoxic single thyroid nodule: Secondary | ICD-10-CM | POA: Diagnosis not present

## 2021-06-26 NOTE — Telephone Encounter (Signed)
I called and lvm for patient relaying message about coming in to see NP in order to get another MRI order placed and to call us back to get scheduled. She can see any NP first available. ?

## 2021-06-26 NOTE — Telephone Encounter (Signed)
Patient left a voicemail on my phone stating she would like to proceed on having an MRI she is still having issues with her eyes. Starting May 1st she is going to have Medicare.  ?

## 2021-06-26 NOTE — Telephone Encounter (Signed)
Noted, thank you Tori.  ?

## 2021-07-09 ENCOUNTER — Other Ambulatory Visit: Payer: Self-pay | Admitting: Internal Medicine

## 2021-07-09 DIAGNOSIS — E041 Nontoxic single thyroid nodule: Secondary | ICD-10-CM

## 2021-07-23 ENCOUNTER — Encounter: Payer: Self-pay | Admitting: Neurology

## 2021-07-23 ENCOUNTER — Ambulatory Visit (INDEPENDENT_AMBULATORY_CARE_PROVIDER_SITE_OTHER): Payer: Medicare Other | Admitting: Neurology

## 2021-07-23 VITALS — BP 120/84 | HR 65 | Ht 66.0 in | Wt 180.6 lb

## 2021-07-23 DIAGNOSIS — R251 Tremor, unspecified: Secondary | ICD-10-CM | POA: Diagnosis not present

## 2021-07-23 DIAGNOSIS — H02401 Unspecified ptosis of right eyelid: Secondary | ICD-10-CM | POA: Diagnosis not present

## 2021-07-23 DIAGNOSIS — R51 Headache with orthostatic component, not elsewhere classified: Secondary | ICD-10-CM

## 2021-07-23 DIAGNOSIS — H539 Unspecified visual disturbance: Secondary | ICD-10-CM

## 2021-07-23 DIAGNOSIS — G43109 Migraine with aura, not intractable, without status migrainosus: Secondary | ICD-10-CM

## 2021-07-23 DIAGNOSIS — G4484 Primary exertional headache: Secondary | ICD-10-CM

## 2021-07-23 NOTE — Progress Notes (Addendum)
[id number]    Provider:  Dr Jaynee Eagles Requesting Provider: Ginger Organ., MD Primary Care Provider:  Ginger Organ., MD  CC:  Headaches  10/24/21: addendum, see scanned, [facility] likely bacterial conjunctivitis, no ptosis noted 08/08/2021, dry eye syndrome, borderline glaucome, following with dr Katy Fitch  Follow up 07/23/2021: She went to [facility], didn't get a chance to have the MRI. If she gets tired at night, her right eyelid doesn't open, like it is stuck, and it'll open if she rub it, or if she keeps trying it'll come back. No pink eye but started with allergies and she had goop in both eyes then. She does have allergies now and still having problems. Her eyes do water, they don't do that anymore, she tried drops for dry eyes did not help. No swallowing problems. No dipopia. No muscle weakness.   She was in the movie and didn't open, she rubbed it and it came back. Once she wa in [place], for 3 night in a row she woke up with hand movement on its own, she rubbed it and stopped, happened 3 nights in a row briefly and hasn't happened since then more of a tremor than a stereotyped movement. No double vision. Every night and every morning unless rested her right eyelid will not open immediately. More of a tremor than a stereotyped movement in the hand, No other focal neurologic deficits, associated symptoms, inciting events or modifiable factors.  Patient complains of symptoms per HPI as well as the following symptoms: eyelid drooping, tremor . Pertinent negatives and positives per HPI. All others negative   HPI:  Olivia Ellison is a 66 y.o. female here as requested by Ginger Organ., MD for migraine. PMHx atypical migraine, hypothyroidism-goiter and thyroid disease.  I reviewed Dr. Ammie Ferrier notes which stated patient had 2 migraines in March which has not occurred for several years.  The first migraine was in 1982 she was working and out on a trip and she couldn't  talk and see followed by a headache which is repeatable. Diagnosed with migraine/ atypical migraine. She used to get them infrequently, would have tunnel vision followed by headache and sleep would help, pulsating/thudding with eye pain in the back of the head, movement made it worse, light sensitivity. She hasn't had a migraine in 20 years and then in March of 2021 she drank some collagen in her coffee and had same-type migraine with tunnel vision followed same headache. On April 2nd of this year she was at a hair salon, she goes home sleep, if she sleeps it goes away, sleeping sooner helps, she has some residual pain in theback of the head which hurts moving neck or pressure or sneezing. In March of 2021 she couldn't talk or think like the first one. On August 29th she had another migraine, she sat on the couch and a smell trigered a migraine and again same, went to bed, can last 4-24 hours but residual "thudding" for a few days. Niece has migraines.   Reviewed notes, labs and imaging from outside physicians, which showed:  From a thorough review of records, medications tried that can be used in migraine management include Benadryl, magnesium,otc analgesics  Review of Systems: Patient complains of symptoms per HPI as well as the following symptoms headache. Pertinent negatives and positives per HPI. All others negative.   Social History   Socioeconomic History   Marital status: Married    Spouse name: Not on file  Number of children: Not on file   Years of education: Not on file   Highest education level: Not on file  Occupational History   Not on file  Tobacco Use   Smoking status: Never   Smokeless tobacco: Never  Vaping Use   Vaping Use: Never used  Substance and Sexual Activity   Alcohol use: Yes    Alcohol/week: 2.0 - 3.0 standard drinks    Types: 2 - 3 Standard drinks or equivalent per week    Comment: occ   Drug use: No   Sexual activity: Yes    Partners: Male    Birth  control/protection: Post-menopausal  Other Topics Concern   Not on file  Social History Narrative   Not on file   Social Determinants of Health   Financial Resource Strain: Not on file  Food Insecurity: Not on file  Transportation Needs: Not on file  Physical Activity: Not on file  Stress: Not on file  Social Connections: Not on file  Intimate Partner Violence: Not on file    Family History  Problem Relation Age of Onset   Parkinson's disease Brother    Colon cancer Paternal Uncle    Colon cancer Paternal Uncle    Colon polyps Neg Hx    Esophageal cancer Neg Hx    Rectal cancer Neg Hx    Stomach cancer Neg Hx     Past Medical History:  Diagnosis Date   Abnormal Pap smear of cervix    chemicl tx, cone bx, and laser, >20+ years ago   Allergy    Atypical migraine    Goiter    multinodular goiter--negative bx    Thyroid disease    hypothyroidism    Patient Active Problem List   Diagnosis Date Noted   HSV infection 08/04/2020   Migraine with aura and without status migrainosus, not intractable 08/04/2020   Hypothyroidism 08/04/2020   Atrophic vaginitis 02/03/2015   Unspecified hypothyroidism 07/13/2013    Past Surgical History:  Procedure Laterality Date   BLEPHAROPLASTY  2012       CERVIX LESION DESTRUCTION  1990   chemical tx'd x2, cone bx and laser   COLONOSCOPY      Current Outpatient Medications  Medication Sig Dispense Refill   BIOTIN PO Take 5,000 mcg by mouth daily.      Calcium Carbonate-Vit D-Min (CALCIUM 1200 PO) Take by mouth daily.     Cholecalciferol (VITAMIN D PO) Take by mouth daily.     Cranberry 1000 MG CAPS Take by mouth.     Cyanocobalamin (VITAMIN B 12 PO) Take by mouth daily.     fexofenadine (ALLEGRA) 180 MG tablet Take 180 mg by mouth as needed.     Krill Oil 350 MG CAPS Take by mouth daily.     levothyroxine (SYNTHROID, LEVOTHROID) 125 MCG tablet Take 100 mcg by mouth daily before breakfast. One whole tablet 6 days a week and Once  a week takes 1/2 pill     magnesium 30 MG tablet Take 30 mg by mouth 2 (two) times daily.     Multiple Vitamins-Minerals (MULTIVITAMIN PO) Take by mouth daily.     Probiotic Product (PROBIOTIC PO) Take by mouth daily.     rizatriptan (MAXALT-MLT) 10 MG disintegrating tablet Take 1 tablet (10 mg total) by mouth as needed for migraine. May repeat in 2 hours if needed 9 tablet 11   valACYclovir (VALTREX) 500 MG tablet TAKE 1 CAPLET BY MOUTH TWICE DAILY AS NEEDED FOR  OUTBREAK     Zinc 50 MG CAPS Take by mouth daily.     Current Facility-Administered Medications  Medication Dose Route Frequency Provider Last Rate Last Admin   0.9 %  sodium chloride infusion  500 mL Intravenous Once Ladene Artist, MD        Allergies as of 07/23/2021 - Review Complete 07/23/2021  Allergen Reaction Noted   Osphena [ospemifene] Other (See Comments) 02/01/2014   Other Other (See Comments) 10/02/2012    Vitals: BP 120/84   Pulse 65   Ht '5\' 6"'$  (1.676 m)   Wt 180 lb 9.6 oz (81.9 kg)   LMP 12/03/2010   BMI 29.15 kg/m  Last Weight:  Wt Readings from Last 1 Encounters:  07/23/21 180 lb 9.6 oz (81.9 kg)   Last Height:   Ht Readings from Last 1 Encounters:  07/23/21 '5\' 6"'$  (1.676 m)   Exam: NAD, pleasant                  Speech:    Speech is normal; fluent and spontaneous with normal comprehension.  Cognition:    The patient is oriented to person, place, and time;     recent and remote memory intact;     language fluent;    Cranial Nerves:    The pupils are equal, round, and reactive to light.Trigeminal sensation is intact and the muscles of mastication are normal. The face is symmetric. The palate elevates in the midline. Hearing intact. Voice is normal. Shoulder shrug is normal. The tongue has normal motion without fasciculations.   Coordination:  No dysmetria  Motor Observation:    No asymmetry, no atrophy, and no involuntary movements noted. Tone:    Normal muscle tone.     Strength:     Strength is V/V in the upper and lower limbs.      Sensation: intact to LT     Assessment/Plan:  Patient with likely migraine with aura but given concerning symptoms recommend MRI brain, also with new symptoms of tremor, difficulty opening right eye. She did not have the MRI completed, will reorder. Unlikely myasthenia gravis but will check antibodies.  MRI brain due to concerning symptoms of positional headaches worse with coughing,vision changes, ptosis, tremor  to look for space occupying mass, chiari or intracranial hypertension (pseudotumor), strokes, demyelination(MS). Discussed increased risk of stroke in migraine with aura, no estrogen or HRT, closely manage other risk factors.    Orders Placed This Encounter  Procedures   MR BRAIN W WO CONTRAST   Myasthenia Gravis Profile   Basic Metabolic Panel   Discussed: To prevent or relieve headaches, try the following: Cool Compress. Lie down and place a cool compress on your head.  Avoid headache triggers. If certain foods or odors seem to have triggered your migraines in the past, avoid them. A headache diary might help you identify triggers.  Include physical activity in your daily routine. Try a daily walk or other moderate aerobic exercise.  Manage stress. Find healthy ways to cope with the stressors, such as delegating tasks on your to-do list.  Practice relaxation techniques. Try deep breathing, yoga, massage and visualization.  Eat regularly. Eating regularly scheduled meals and maintaining a healthy diet might help prevent headaches. Also, drink plenty of fluids.  Follow a regular sleep schedule. Sleep deprivation might contribute to headaches Consider biofeedback. With this mind-body technique, you learn to control certain bodily functions -- such as muscle tension, heart rate and blood pressure -- to prevent  headaches or reduce headache pain.    Proceed to emergency room if you experience new or worsening symptoms or symptoms do  not resolve, if you have new neurologic symptoms or if headache is severe, or for any concerning symptom.   Provided education and documentation from American headache Society toolbox including articles on: chronic migraine medication overuse headache, chronic migraines, prevention of migraines, behavioral and other nonpharmacologic treatments for headache.   Cc: Ginger Organ., MD,  Ginger Organ., MD  Sarina Ill, MD  [facility] [place], [place] [address]  Phone [phone] Fax [phone]  I spent 40 minutes of face-to-face and non-face-to-face time with patient on the  1. Migraine with aura and without status migrainosus, not intractable   2. Ptosis of right eyelid   3. Tremor   4. Positional headache   5. Vision changes   6. Ptosis, right eyelid   7. Exertional headache    diagnosis.  This included previsit chart review, lab review, study review, order entry, electronic health record documentation, patient education on the different diagnostic and therapeutic options, counseling and coordination of care, risks and benefits of management, compliance, or risk factor reduction

## 2021-07-23 NOTE — Patient Instructions (Addendum)
[facility]    Provider:  Dr Jaynee Eagles Requesting Provider: Ginger Organ., MD Primary Care Provider:  Ginger Organ., MD  CC:  Headaches  HPI:  Olivia Ellison is a 66 y.o. female here as requested by Ginger Organ., MD for migraine. PMHx atypical migraine, hypothyroidism-goiter and thyroid disease.  I reviewed Dr. Ammie Ferrier notes which stated patient had 2 migraines in March which has not occurred for several years.  The first migraine was in 1982 she was working and out on a trip and she couldn't talk and see followed by a headache which is repeatable. Diagnosed with migraine/ atypical migraine. She used to get them infrequently, would have tunnel vision followed by headache and sleep would help, pulsating/thudding with eye pain in the back of the head, movement made it worse, light sensitivity. She hasn't had a migraine in 20 years and then in March of 2021 she drank some collagen in her coffee and had same-type migraine with tunnel vision followed same headache. On April 2nd of this year she was at a hair salon, she goes home sleep, if she sleeps it goes away, sleeping sooner helps, she has some residual pain in theback of the head which hurts moving neck or pressure or sneezing. In March of 2021 she couldn't talk or think like the first one. On August 29th she had another migraine, she sat on the couch and a smell trigered a migraine and again same, went to bed, can last 4-24 hours but residual "thudding" for a few days. Niece has migraines. No more hand issues but the eye happens daily.   Reviewed notes, labs and imaging from outside physicians, which showed:  From a thorough review of records, medications tried that can be used in migraine management include Benadryl, magnesium,otc analgesics  Review of Systems: Patient complains of symptoms per HPI as well as the following symptoms headache. Pertinent negatives and positives per HPI. All others  negative.   Social History   Socioeconomic History   Marital status: Married    Spouse name: Not on file   Number of children: Not on file   Years of education: Not on file   Highest education level: Not on file  Occupational History   Not on file  Tobacco Use   Smoking status: Never   Smokeless tobacco: Never  Vaping Use   Vaping Use: Never used  Substance and Sexual Activity   Alcohol use: Yes    Alcohol/week: 2.0 - 3.0 standard drinks    Types: 2 - 3 Standard drinks or equivalent per week    Comment: occ   Drug use: No   Sexual activity: Yes    Partners: Male    Birth control/protection: Post-menopausal  Other Topics Concern   Not on file  Social History Narrative   Not on file   Social Determinants of Health   Financial Resource Strain: Not on file  Food Insecurity: Not on file  Transportation Needs: Not on file  Physical Activity: Not on file  Stress: Not on file  Social Connections: Not on file  Intimate Partner Violence: Not on file    Family History  Problem Relation Age of Onset   Parkinson's disease Brother    Colon cancer Paternal Uncle    Colon cancer Paternal Uncle    Colon polyps Neg Hx    Esophageal cancer Neg Hx    Rectal cancer Neg Hx    Stomach cancer Neg Hx  Past Medical History:  Diagnosis Date   Abnormal Pap smear of cervix    chemicl tx, cone bx, and laser, >20+ years ago   Allergy    Atypical migraine    Goiter    multinodular goiter--negative bx    Thyroid disease    hypothyroidism    Patient Active Problem List   Diagnosis Date Noted   HSV infection 08/04/2020   Migraine with aura and without status migrainosus, not intractable 08/04/2020   Hypothyroidism 08/04/2020   Atrophic vaginitis 02/03/2015   Unspecified hypothyroidism 07/13/2013    Past Surgical History:  Procedure Laterality Date   BLEPHAROPLASTY  2012       CERVIX LESION DESTRUCTION  1990   chemical tx'd x2, cone bx and laser   COLONOSCOPY       Current Outpatient Medications  Medication Sig Dispense Refill   BIOTIN PO Take 5,000 mcg by mouth daily.      Calcium Carbonate-Vit D-Min (CALCIUM 1200 PO) Take by mouth daily.     Cholecalciferol (VITAMIN D PO) Take by mouth daily.     Cranberry 1000 MG CAPS Take by mouth.     Cyanocobalamin (VITAMIN B 12 PO) Take by mouth daily.     fexofenadine (ALLEGRA) 180 MG tablet Take 180 mg by mouth as needed.     Krill Oil 350 MG CAPS Take by mouth daily.     levothyroxine (SYNTHROID, LEVOTHROID) 125 MCG tablet Take 100 mcg by mouth daily before breakfast. One whole tablet 6 days a week and Once a week takes 1/2 pill     magnesium 30 MG tablet Take 30 mg by mouth 2 (two) times daily.     Multiple Vitamins-Minerals (MULTIVITAMIN PO) Take by mouth daily.     Probiotic Product (PROBIOTIC PO) Take by mouth daily.     rizatriptan (MAXALT-MLT) 10 MG disintegrating tablet Take 1 tablet (10 mg total) by mouth as needed for migraine. May repeat in 2 hours if needed 9 tablet 11   valACYclovir (VALTREX) 500 MG tablet TAKE 1 CAPLET BY MOUTH TWICE DAILY AS NEEDED FOR OUTBREAK     Zinc 50 MG CAPS Take by mouth daily.     Current Facility-Administered Medications  Medication Dose Route Frequency Provider Last Rate Last Admin   0.9 %  sodium chloride infusion  500 mL Intravenous Once Ladene Artist, MD        Allergies as of 07/23/2021 - Review Complete 07/23/2021  Allergen Reaction Noted   Osphena [ospemifene] Other (See Comments) 02/01/2014   Other Other (See Comments) 10/02/2012    Vitals: BP 120/84   Pulse 65   Ht '5\' 6"'$  (1.676 m)   Wt 180 lb 9.6 oz (81.9 kg)   LMP 12/03/2010   BMI 29.15 kg/m  Last Weight:  Wt Readings from Last 1 Encounters:  07/23/21 180 lb 9.6 oz (81.9 kg)   Last Height:   Ht Readings from Last 1 Encounters:  07/23/21 '5\' 6"'$  (1.676 m)     Physical exam: Exam: Gen: NAD, conversant, well nourised,  well groomed                     CV: RRR, no MRG. No Carotid  Bruits. No peripheral edema, warm, nontender Eyes: Conjunctivae clear without exudates or hemorrhage  Neuro: Detailed Neurologic Exam  Speech:    Speech is normal; fluent and spontaneous with normal comprehension.  Cognition:    The patient is oriented to person, place, and time;  recent and remote memory intact;     language fluent;     normal attention, concentration,     fund of knowledge Cranial Nerves:    The pupils are equal, round, and reactive to light. The fundi are flat Visual fields are full to finger confrontation. Extraocular movements are intact. Trigeminal sensation is intact and the muscles of mastication are normal. The face is symmetric. The palate elevates in the midline. Hearing intact. Voice is normal. Shoulder shrug is normal. The tongue has normal motion without fasciculations.   Coordination:    Normal finger to nose  Gait:    normal.   Motor Observation:    No asymmetry, no atrophy, and no involuntary movements noted. Tone:    Normal muscle tone.    Posture:    Posture is normal. normal erect    Strength:    Strength is V/V in the upper and lower limbs.      Sensation: intact to LT     Reflex Exam:  DTR's:    Deep tendon reflexes in the upper and lower extremities are normal bilaterally.   Toes:    The toes are downgoing bilaterally.   Clonus:    Clonus is absent.    Assessment/Plan:  Patient with likely migraine with aura but given concerning symptoms recommend MRI brain  MRI brain due to concerning symptoms of positional headaches worse with coughing,vision changes  to look for space occupying mass, chiari or intracranial hypertension (pseudotumor), strokes, demyelination(MS). Discussed increased risk of stroke in migraine with aura, no estrogen or HRT, closely manage other risk factors. Also ptosis of the right eyelid vs right eyelid apraxia and tremor or the right hand.    No orders of the defined types were placed in this  encounter.   No orders of the defined types were placed in this encounter.  Discussed: To prevent or relieve headaches, try the following: Cool Compress. Lie down and place a cool compress on your head.  Avoid headache triggers. If certain foods or odors seem to have triggered your migraines in the past, avoid them. A headache diary might help you identify triggers.  Include physical activity in your daily routine. Try a daily walk or other moderate aerobic exercise.  Manage stress. Find healthy ways to cope with the stressors, such as delegating tasks on your to-do list.  Practice relaxation techniques. Try deep breathing, yoga, massage and visualization.  Eat regularly. Eating regularly scheduled meals and maintaining a healthy diet might help prevent headaches. Also, drink plenty of fluids.  Follow a regular sleep schedule. Sleep deprivation might contribute to headaches Consider biofeedback. With this mind-body technique, you learn to control certain bodily functions -- such as muscle tension, heart rate and blood pressure -- to prevent headaches or reduce headache pain.    Proceed to emergency room if you experience new or worsening symptoms or symptoms do not resolve, if you have new neurologic symptoms or if headache is severe, or for any concerning symptom.   Provided education and documentation from American headache Society toolbox including articles on: chronic migraine medication overuse headache, chronic migraines, prevention of migraines, behavioral and other nonpharmacologic treatments for headache.   Cc: Ginger Organ., MD,  Ginger Organ., MD  Sarina Ill, MD  [facility] [address] [place], [place] [address]  Phone [phone] Fax [phone]

## 2021-07-24 ENCOUNTER — Telehealth: Payer: Self-pay | Admitting: Neurology

## 2021-07-24 NOTE — Telephone Encounter (Signed)
Medicare [place] sent to GI they will call the patient  to schedule

## 2021-07-26 ENCOUNTER — Ambulatory Visit
Admission: RE | Admit: 2021-07-26 | Discharge: 2021-07-26 | Disposition: A | Discharge status: Home / Self Care | Payer: Medicare Other | Source: Ambulatory Visit | Attending: Neurology | Admitting: Neurology

## 2021-07-26 DIAGNOSIS — G4484 Primary exertional headache: Secondary | ICD-10-CM

## 2021-07-26 DIAGNOSIS — R51 Headache with orthostatic component, not elsewhere classified: Secondary | ICD-10-CM

## 2021-07-26 DIAGNOSIS — H539 Unspecified visual disturbance: Secondary | ICD-10-CM

## 2021-07-26 DIAGNOSIS — H02401 Unspecified ptosis of right eyelid: Secondary | ICD-10-CM

## 2021-07-26 DIAGNOSIS — R251 Tremor, unspecified: Secondary | ICD-10-CM

## 2021-07-26 MED ORDER — GADOBENATE DIMEGLUMINE 529 MG/ML IV SOLN
15.0000 mL | Freq: Once | INTRAVENOUS | Status: AC | PRN
  Administered 2021-07-26: 15 mL via INTRAVENOUS

## 2021-07-27 ENCOUNTER — Other Ambulatory Visit: Payer: Self-pay | Admitting: Internal Medicine

## 2021-07-27 ENCOUNTER — Ambulatory Visit
Admission: RE | Admit: 2021-07-27 | Discharge: 2021-07-27 | Disposition: A | Discharge status: Home / Self Care | Payer: Self-pay | Source: Ambulatory Visit | Attending: Internal Medicine | Admitting: Internal Medicine

## 2021-07-27 DIAGNOSIS — E041 Nontoxic single thyroid nodule: Secondary | ICD-10-CM

## 2021-08-08 ENCOUNTER — Other Ambulatory Visit (HOSPITAL_COMMUNITY)
Admission: RE | Admit: 2021-08-08 | Discharge: 2021-08-08 | Disposition: A | Discharge status: Home / Self Care | Payer: Medicare Other | Source: Ambulatory Visit | Attending: Internal Medicine | Admitting: Internal Medicine

## 2021-08-08 ENCOUNTER — Ambulatory Visit
Admission: RE | Admit: 2021-08-08 | Discharge: 2021-08-08 | Disposition: A | Discharge status: Home / Self Care | Payer: Medicare Other | Source: Ambulatory Visit | Attending: Internal Medicine | Admitting: Internal Medicine

## 2021-08-08 DIAGNOSIS — E041 Nontoxic single thyroid nodule: Secondary | ICD-10-CM | POA: Diagnosis present

## 2021-08-09 ENCOUNTER — Ambulatory Visit: Payer: BC Managed Care – PPO | Admitting: Neurology

## 2021-08-09 ENCOUNTER — Ambulatory Visit (HOSPITAL_BASED_OUTPATIENT_CLINIC_OR_DEPARTMENT_OTHER): Payer: BC Managed Care – PPO | Admitting: Obstetrics & Gynecology

## 2021-08-09 LAB — CYTOLOGY - NON PAP

## 2021-08-16 LAB — BASIC METABOLIC PANEL
BUN/Creatinine Ratio: 18 (ref 12–28)
BUN: 17 mg/dL (ref 8–27)
CO2: 25 mmol/L (ref 20–29)
Calcium: 10.2 mg/dL (ref 8.7–10.3)
Chloride: 103 mmol/L (ref 96–106)
Creatinine, Ser: 0.93 mg/dL (ref 0.57–1.00)
Glucose: 108 mg/dL — ABNORMAL HIGH (ref 70–99)
Potassium: 4.9 mmol/L (ref 3.5–5.2)
Sodium: 140 mmol/L (ref 134–144)
eGFR: 68 mL/min/{1.73_m2} (ref >59)

## 2021-08-16 LAB — MYASTHENIA GRAVIS PROFILE
AChR Binding Ab, Serum: <0.03 nmol/L (ref 0.00–0.24)
AChR-modulating Ab: 0 % (ref 0–45)
Acetylchol Block Ab: 17 % (ref 0–25)
Anti-striation Abs: NEGATIVE

## 2021-08-16 LAB — MUSK ANTIBODIES: MuSK Antibodies: <1 U/mL

## 2021-08-20 ENCOUNTER — Ambulatory Visit (HOSPITAL_BASED_OUTPATIENT_CLINIC_OR_DEPARTMENT_OTHER): Payer: BC Managed Care – PPO | Admitting: Obstetrics & Gynecology

## 2021-08-23 ENCOUNTER — Encounter (HOSPITAL_BASED_OUTPATIENT_CLINIC_OR_DEPARTMENT_OTHER): Payer: Self-pay | Admitting: Obstetrics & Gynecology

## 2021-08-23 ENCOUNTER — Ambulatory Visit (INDEPENDENT_AMBULATORY_CARE_PROVIDER_SITE_OTHER): Payer: Medicare Other | Admitting: Obstetrics & Gynecology

## 2021-08-23 VITALS — BP 116/75 | HR 68 | Ht 65.0 in | Wt 181.6 lb

## 2021-08-23 DIAGNOSIS — N952 Postmenopausal atrophic vaginitis: Secondary | ICD-10-CM

## 2021-08-23 DIAGNOSIS — Z9189 Other specified personal risk factors, not elsewhere classified: Secondary | ICD-10-CM

## 2021-08-23 DIAGNOSIS — G43109 Migraine with aura, not intractable, without status migrainosus: Secondary | ICD-10-CM | POA: Diagnosis not present

## 2021-08-23 DIAGNOSIS — B009 Herpesviral infection, unspecified: Secondary | ICD-10-CM

## 2021-08-23 NOTE — Progress Notes (Unsigned)
66 y.o. Olivia Ellison Married White or Caucasian female here for breast and pelvic exam.  I am also following her for h/o vaginal atrophy.  Denies vaginal bleeding.  Had [employer] in [place].  This has really helped.  Denies pain with intercourse.    Patient's last menstrual period was 12/03/2010.          Sexually active: Yes.    H/O STD:  no  Health Maintenance: PCP:  Dr. Brigitte Pulse.  Last wellness appt was earlier this year.  Did blood work at that appt: yes  Vaccines are up to date:  reviewed.  Prevnar 20 discussed. Colonoscopy:  09/14/2018 MMG:  03/20/2021 Negative BMD:  has done with Dr. Brigitte Pulse Last pap smear:  08/2020, neg H/o abnormal pap smear:  remote hx (>20 years ago)    reports that she has never smoked. She has never used smokeless tobacco. She reports current alcohol use of about 2.0 - 3.0 standard drinks of alcohol per week. She reports that she does not use drugs.  Past Medical History:  Diagnosis Date   Abnormal Pap smear of cervix    chemicl tx, cone bx, and laser, >20+ years ago   Allergy    Atypical migraine    Goiter    multinodular goiter--negative bx    Thyroid disease    hypothyroidism    Past Surgical History:  Procedure Laterality Date   BLEPHAROPLASTY  2012       CERVIX LESION DESTRUCTION  1990   chemical tx'd x2, cone bx and laser   COLONOSCOPY      Current Outpatient Medications  Medication Sig Dispense Refill   BIOTIN PO Take 5,000 mcg by mouth daily.      Calcium Carbonate-Vit D-Min (CALCIUM 1200 PO) Take by mouth daily.     Cholecalciferol (VITAMIN D PO) Take by mouth daily.     Cranberry 1000 MG CAPS Take by mouth.     Cyanocobalamin (VITAMIN B 12 PO) Take by mouth daily.     fexofenadine (ALLEGRA) 180 MG tablet Take 180 mg by mouth as needed.     Krill Oil 350 MG CAPS Take by mouth daily.     levothyroxine (SYNTHROID, LEVOTHROID) 125 MCG tablet Take 100 mcg by mouth daily before breakfast. One whole tablet 6 days a week and Once a week takes  1/2 pill     magnesium 30 MG tablet Take 30 mg by mouth 2 (two) times daily.     Multiple Vitamins-Minerals (MULTIVITAMIN PO) Take by mouth daily.     Probiotic Product (PROBIOTIC PO) Take by mouth daily.     rizatriptan (MAXALT-MLT) 10 MG disintegrating tablet Take 1 tablet (10 mg total) by mouth as needed for migraine. May repeat in 2 hours if needed 9 tablet 11   valACYclovir (VALTREX) 500 MG tablet TAKE 1 CAPLET BY MOUTH TWICE DAILY AS NEEDED FOR OUTBREAK     Current Facility-Administered Medications  Medication Dose Route Frequency Provider Last Rate Last Admin   0.9 %  sodium chloride infusion  500 mL Intravenous Once Ladene Artist, MD        Family History  Problem Relation Age of Onset   Parkinson's disease Brother    Colon cancer Paternal Uncle    Colon cancer Paternal Uncle    Colon polyps Neg Hx    Esophageal cancer Neg Hx    Rectal cancer Neg Hx    Stomach cancer Neg Hx     Review of Systems  All  other systems reviewed and are negative.   Exam:   BP 116/75 (BP Location: Left Arm, Patient Position: Sitting, Cuff Size: Large)   Pulse 68   Ht '5\' 5"'$  (1.651 m) Comment: reported  Wt 181 lb 9.6 oz (82.4 kg)   LMP 12/03/2010   BMI 30.22 kg/m   Height: '5\' 5"'$  (165.1 cm) (reported)  General appearance: alert, cooperative and appears stated age Breasts: normal appearance, no masses or tenderness Abdomen: soft, non-tender; bowel sounds normal; no masses,  no organomegaly Lymph nodes: Cervical, supraclavicular, and axillary nodes normal.  No abnormal inguinal nodes palpated Neurologic: Grossly normal  Pelvic: External genitalia:  no lesions              Urethra:  normal appearing urethra with no masses, tenderness or lesions              Bartholins and Skenes: normal                 Vagina: normal appearing vagina with atrophic changes and no discharge, no lesions              Cervix: no lesions              Pap taken: No. Bimanual Exam:  Uterus:  normal size,  contour, position, consistency, mobility, non-tender              Adnexa: normal adnexa and no mass, fullness, tenderness               Rectovaginal: Confirms               Anus:  normal sphincter tone, no lesions  Chaperone, Octaviano Batty, CMA, was present for exam.  Assessment/Plan: There are no diagnoses linked to this encounter.

## 2021-08-26 DIAGNOSIS — E042 Nontoxic multinodular goiter: Secondary | ICD-10-CM | POA: Insufficient documentation

## 2021-09-03 ENCOUNTER — Ambulatory Visit (INDEPENDENT_AMBULATORY_CARE_PROVIDER_SITE_OTHER): Payer: Medicare Other | Admitting: Obstetrics & Gynecology

## 2021-09-03 ENCOUNTER — Other Ambulatory Visit (HOSPITAL_COMMUNITY)
Admission: RE | Admit: 2021-09-03 | Discharge: 2021-09-03 | Disposition: A | Discharge status: Home / Self Care | Payer: Medicare Other | Source: Ambulatory Visit | Attending: Obstetrics & Gynecology | Admitting: Obstetrics & Gynecology

## 2021-09-03 ENCOUNTER — Encounter (HOSPITAL_BASED_OUTPATIENT_CLINIC_OR_DEPARTMENT_OTHER): Payer: Self-pay | Admitting: Obstetrics & Gynecology

## 2021-09-03 VITALS — BP 117/83 | HR 67

## 2021-09-03 DIAGNOSIS — N843 Polyp of vulva: Secondary | ICD-10-CM | POA: Diagnosis not present

## 2021-09-03 DIAGNOSIS — N9089 Other specified noninflammatory disorders of vulva and perineum: Secondary | ICD-10-CM | POA: Insufficient documentation

## 2021-09-03 NOTE — Progress Notes (Unsigned)
GYNECOLOGY  VISIT  CC:   excision of lesion  HPI: 66 y.o. G0P0000 Married White or Caucasian female here for excision of pigmented vulvar lesion.  Consent obtained.  Does not have any questions. Would just like lesion removed.   Past Medical History:  Diagnosis Date   Abnormal Pap smear of cervix    chemicl tx, cone bx, and laser, >20+ years ago   Allergy    Atypical migraine    Goiter    multinodular goiter--negative bx    Thyroid disease    hypothyroidism    MEDS:   Current Outpatient Medications on File Prior to Visit  Medication Sig Dispense Refill   BIOTIN PO Take 5,000 mcg by mouth daily.      Calcium Carbonate-Vit D-Min (CALCIUM 1200 PO) Take by mouth daily.     Cholecalciferol (VITAMIN D PO) Take by mouth daily.     Cranberry 1000 MG CAPS Take by mouth.     Cyanocobalamin (VITAMIN B 12 PO) Take by mouth daily.     fexofenadine (ALLEGRA) 180 MG tablet Take 180 mg by mouth as needed.     Krill Oil 350 MG CAPS Take by mouth daily.     levothyroxine (SYNTHROID, LEVOTHROID) 125 MCG tablet Take 100 mcg by mouth daily before breakfast. One whole tablet 6 days a week and Once a week takes 1/2 pill     magnesium 30 MG tablet Take 30 mg by mouth 2 (two) times daily.     Multiple Vitamins-Minerals (MULTIVITAMIN PO) Take by mouth daily.     Probiotic Product (PROBIOTIC PO) Take by mouth daily.     rizatriptan (MAXALT-MLT) 10 MG disintegrating tablet Take 1 tablet (10 mg total) by mouth as needed for migraine. May repeat in 2 hours if needed 9 tablet 11   valACYclovir (VALTREX) 500 MG tablet TAKE 1 CAPLET BY MOUTH TWICE DAILY AS NEEDED FOR OUTBREAK     Current Facility-Administered Medications on File Prior to Visit  Medication Dose Route Frequency Provider Last Rate Last Admin   0.9 %  sodium chloride infusion  500 mL Intravenous Once Ladene Artist, MD        ALLERGIES: Osphena [ospemifene] and Other  SH:  married, non smoker  PHYSICAL EXAMINATION:    BP 117/83 (BP  Location: Right Arm, Patient Position: Sitting, Cuff Size: Large)   Pulse 67   LMP 12/03/2010     General appearance: alert, cooperative and appears stated age Lymph:  no inguinal LAD noted  Pelvic: External genitalia:  npigmented and raised lesion on right vular region, inferiorly and lateral to labia major              Urethra:  normal appearing urethra with no masses, tenderness or lesions               Procedure:  Consent obtained.  Area cleansed with Betadine x 3.  3.0cc 1% Lidocaine instilled.  Lesion removed with sterile scissors and pick-ups.  Silver nitrate used for excellent hemostasis.  Pt tolerated procedure well.  Chaperone, Octaviano Batty, CMA, was present for exam.  Assessment/Plan: 1. Vulvar lesion - Surgical pathology( [facility]) - pt will be called with pathology results - post procedure care discussed

## 2021-09-05 LAB — SURGICAL PATHOLOGY

## 2021-09-10 ENCOUNTER — Ambulatory Visit: Payer: BC Managed Care – PPO | Admitting: Adult Health

## 2021-10-23 ENCOUNTER — Ambulatory Visit: Payer: Medicare Other | Admitting: Neurology

## 2022-03-28 IMAGING — MG DIGITAL SCREENING BILAT W/ TOMO W/ CAD
6 of 10 series · 6 of 30 positions shown · non-contrast
Comparison: Previous exam(s).

CLINICAL DATA: Screening.

EXAM:
DIGITAL SCREENING BILATERAL MAMMOGRAM WITH TOMO AND CAD

[R MLO synth-2D]
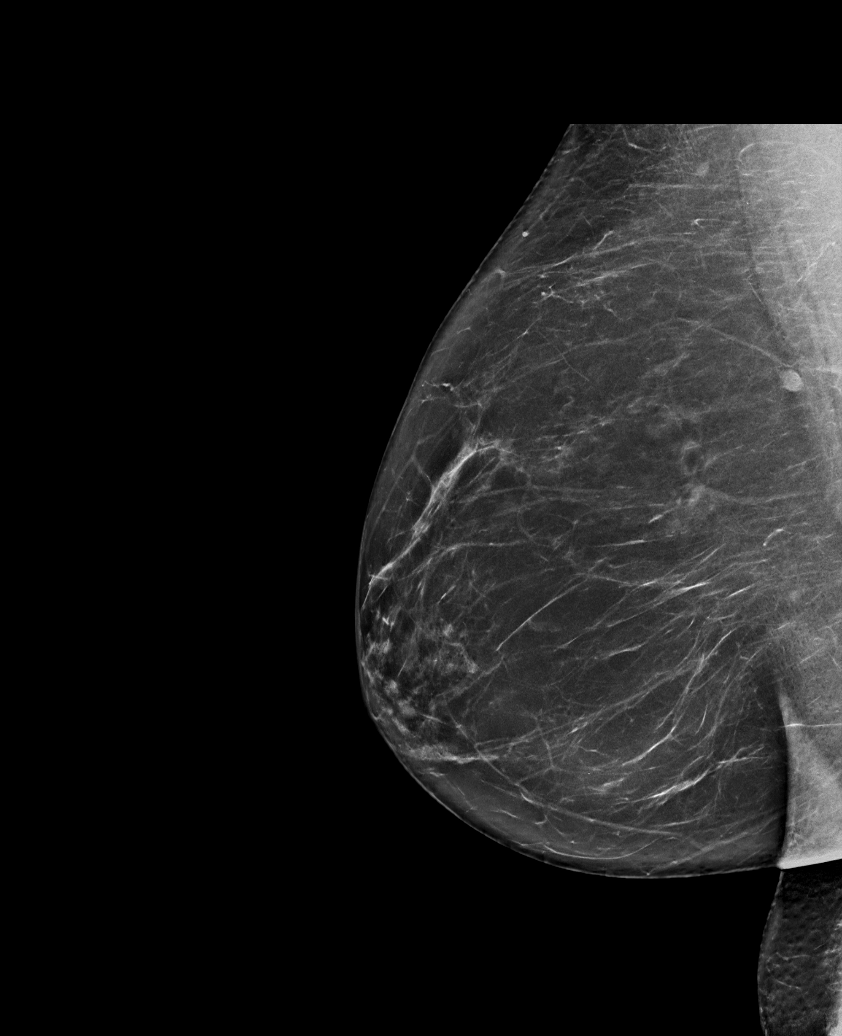

[L CC synth-2D]
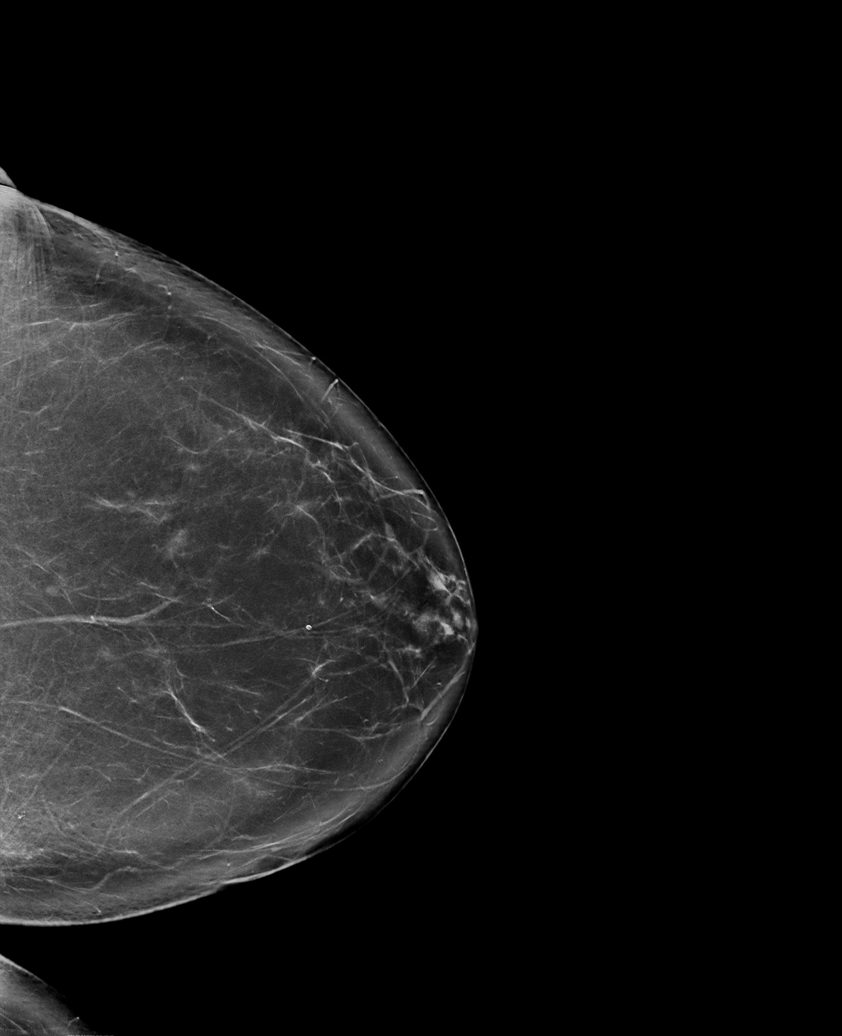

[L MLO synth-2D (1 of 2)]
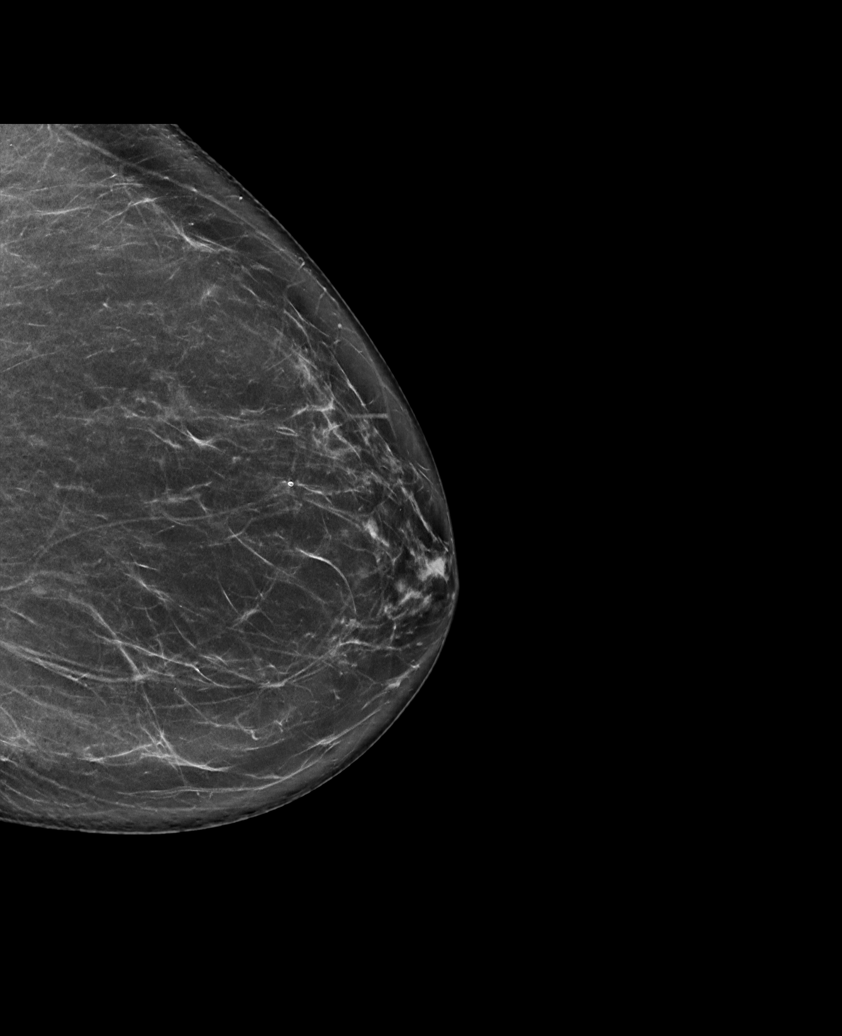

[L MLO synth-2D (2 of 2)]
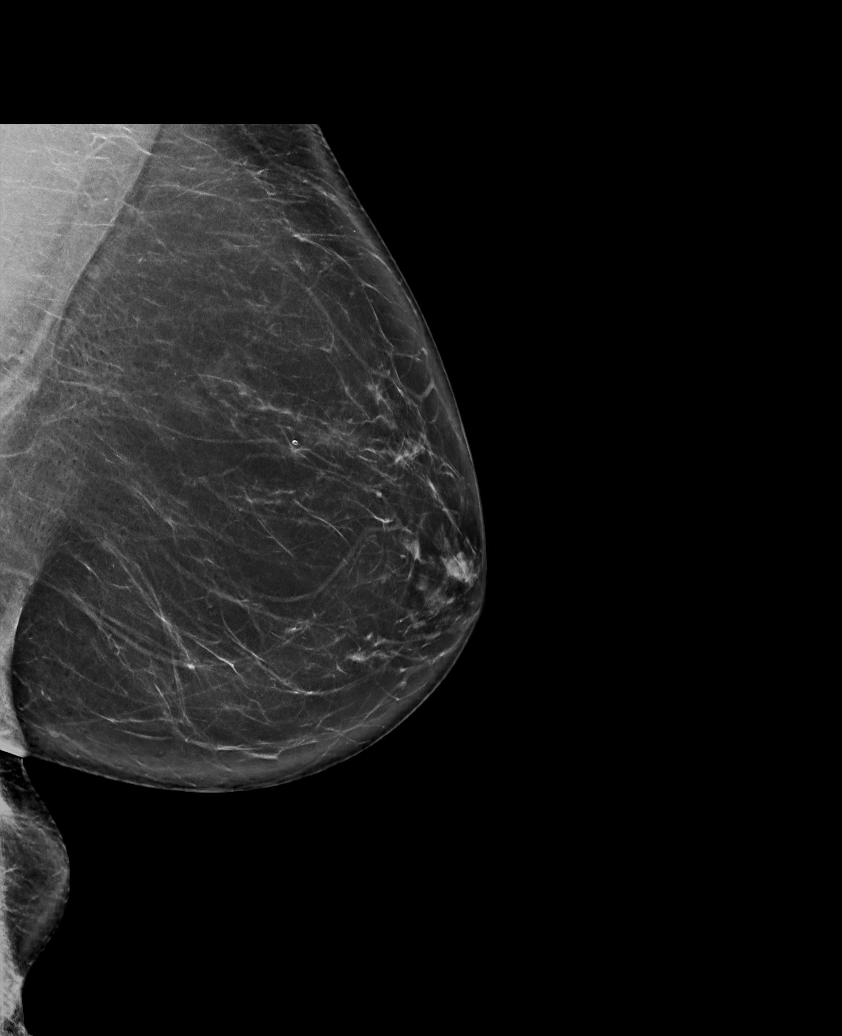

[R CC synth-2D]
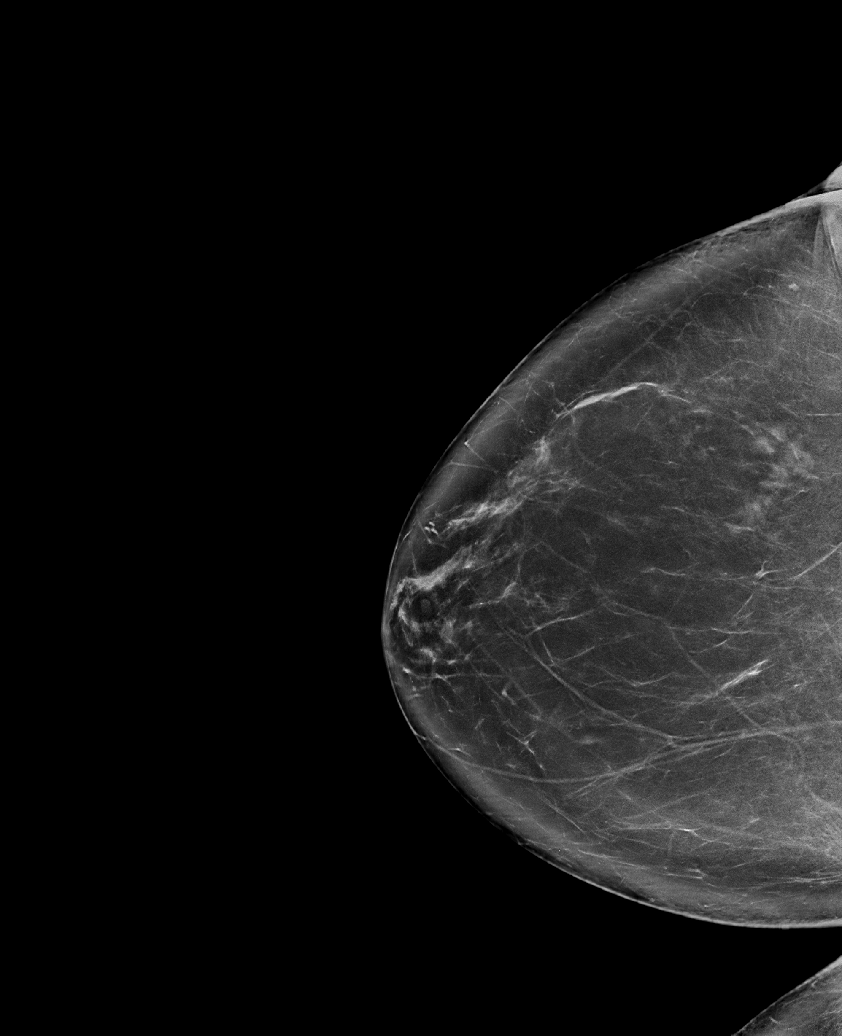

[R MLO tomo · tomo slice 41/81.0]
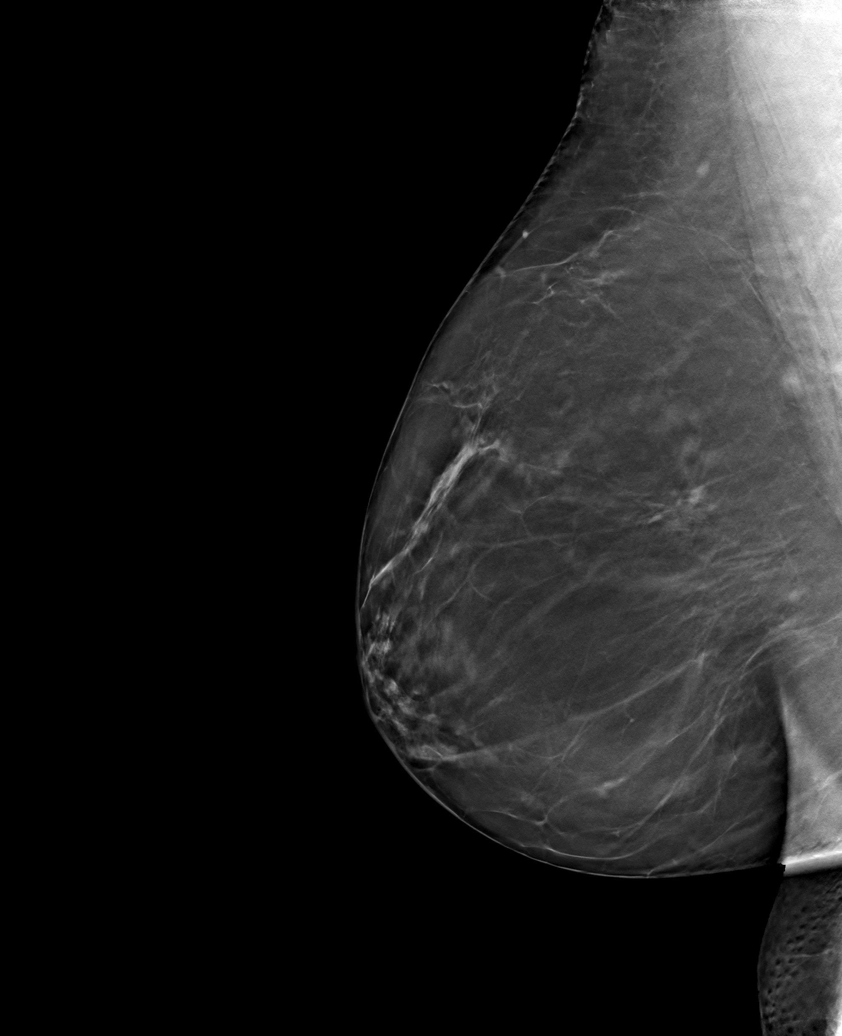

[6 of 30 positions shown; findings below may reference images not displayed]

ACR Breast Density Category b: There are scattered areas of
fibroglandular density.
FINDINGS: There are no findings suspicious for malignancy. Images were
processed with CAD.
IMPRESSION: No mammographic evidence of malignancy. A result letter of this
screening mammogram will be mailed directly to the patient.

RECOMMENDATION:
Screening mammogram in one year. (Code:[id number])

BI-RADS CATEGORY  1: Negative.

## 2022-04-23 ENCOUNTER — Other Ambulatory Visit: Payer: Self-pay | Admitting: Obstetrics & Gynecology

## 2022-04-23 DIAGNOSIS — Z1231 Encounter for screening mammogram for malignant neoplasm of breast: Secondary | ICD-10-CM

## 2022-06-06 HISTORY — PX: SEPTOPLASTY: SHX2393

## 2022-07-09 ENCOUNTER — Ambulatory Visit
Admission: RE | Admit: 2022-07-09 | Discharge: 2022-07-09 | Disposition: A | Discharge status: Home / Self Care | Payer: Medicare Other | Source: Ambulatory Visit | Attending: Obstetrics & Gynecology | Admitting: Obstetrics & Gynecology

## 2022-07-09 DIAGNOSIS — Z1231 Encounter for screening mammogram for malignant neoplasm of breast: Secondary | ICD-10-CM

## 2022-09-16 ENCOUNTER — Ambulatory Visit (HOSPITAL_BASED_OUTPATIENT_CLINIC_OR_DEPARTMENT_OTHER): Payer: Medicare Other | Admitting: Obstetrics & Gynecology

## 2022-09-16 ENCOUNTER — Encounter (HOSPITAL_BASED_OUTPATIENT_CLINIC_OR_DEPARTMENT_OTHER): Payer: Self-pay | Admitting: Obstetrics & Gynecology

## 2022-09-16 VITALS — BP 116/74 | HR 66 | Ht 66.0 in | Wt 178.2 lb

## 2022-09-16 DIAGNOSIS — Z9189 Other specified personal risk factors, not elsewhere classified: Secondary | ICD-10-CM | POA: Diagnosis not present

## 2022-09-16 DIAGNOSIS — E039 Hypothyroidism, unspecified: Secondary | ICD-10-CM

## 2022-09-16 DIAGNOSIS — N952 Postmenopausal atrophic vaginitis: Secondary | ICD-10-CM

## 2022-09-16 DIAGNOSIS — B009 Herpesviral infection, unspecified: Secondary | ICD-10-CM

## 2022-09-16 MED ORDER — SYNTHROID 100 MCG PO TABS: 100.0000 ug | ORAL_TABLET | Freq: Every day | ORAL | 4 refills | Status: AC

## 2022-09-16 MED ORDER — VALACYCLOVIR HCL 500 MG PO TABS: ORAL_TABLET | ORAL | 2 refills | Status: AC

## 2022-09-16 NOTE — Progress Notes (Signed)
67 y.o. G0P0000 Married White or Caucasian female here for breast and pelvic exam.  I am also following her for h/o vaginal atrophy.  Denies vaginal bleeding.  Did undergo [facility] Touch in [place].  It has helped. She is SA.    Did have septoplasty done in April in [place].  Now having a lot of drainage so will follow up with ENT in [place] who did her surgery.  PCP is Dr. Clelia Croft but doesn't have an appt until next year.  Needs RF for synthroid.  Desired branded only.  Thyroid function is always normal.  Offered testing but she declined.    No recent HSV outbreaks.  Will update rx due to   Patient's last menstrual period was 12/03/2010.          Sexually active: Yes.     Health Maintenance: PCP:  Dr. Clelia Croft.  Last wellness appt was 2023.  Did blood work at that appt:  yes Vaccines are up to date: has not done pneumonia vaccination yet Colonoscopy:  09/14/2018, follow up 5 years MMG:  07/09/2022 Negative BMD:  will have her BMD done with [facility]  Last pap smear:  08/04/2020 Negative.      reports that she has never smoked. She has never used smokeless tobacco. She reports current alcohol use of about 2.0 - 3.0 standard drinks of alcohol per week. She reports that she does not use drugs.  Past Medical History:  Diagnosis Date   Abnormal Pap smear of cervix    chemicl tx, cone bx, and laser, >20+ years ago   Allergy    Atypical migraine    Goiter    multinodular goiter--negative bx    Thyroid disease    hypothyroidism    Past Surgical History:  Procedure Laterality Date   BLEPHAROPLASTY  2012       CERVIX LESION DESTRUCTION  1990   chemical tx'd x2, cone bx and laser   COLONOSCOPY     SEPTOPLASTY  06/06/2022   in [place]    Current Outpatient Medications  Medication Sig Dispense Refill   BIOTIN PO Take 5,000 mcg by mouth daily.      Calcium Carbonate-Vit D-Min (CALCIUM 1200 PO) Take by mouth daily.     Cholecalciferol (VITAMIN D PO) Take by mouth daily.      Cyanocobalamin (VITAMIN B 12 PO) Take by mouth daily.     fexofenadine (ALLEGRA) 180 MG tablet Take 180 mg by mouth as needed.     Krill Oil 350 MG CAPS Take by mouth daily.     magnesium 30 MG tablet Take 30 mg by mouth 2 (two) times daily.     Multiple Vitamins-Minerals (MULTIVITAMIN PO) Take by mouth daily.     Probiotic Product (PROBIOTIC PO) Take by mouth daily.     rizatriptan (MAXALT-MLT) 10 MG disintegrating tablet Take 1 tablet (10 mg total) by mouth as needed for migraine. May repeat in 2 hours if needed 9 tablet 11   valACYclovir (VALTREX) 500 MG tablet TAKE 1 CAPLET BY MOUTH TWICE DAILY AS NEEDED FOR OUTBREAK     Current Facility-Administered Medications  Medication Dose Route Frequency Provider Last Rate Last Admin   0.9 %  sodium chloride infusion  500 mL Intravenous Once Meryl Dare, MD        Family History  Problem Relation Age of Onset   Parkinson's disease Brother    Heart attack Brother    Colon cancer Paternal Uncle    Colon  cancer Paternal Uncle    Colon polyps Neg Hx    Esophageal cancer Neg Hx    Rectal cancer Neg Hx    Stomach cancer Neg Hx     Review of Systems  Constitutional: Negative.   Genitourinary: Negative.     Exam:   BP 116/74 (BP Location: Left Arm, Patient Position: Sitting, Cuff Size: Large)   Pulse 66   Ht 5\' 6"  (1.676 m) Comment: Reported  Wt 178 lb 3.2 oz (80.8 kg)   LMP 12/03/2010   BMI 28.76 kg/m   Height: 5\' 6"  (167.6 cm) (Reported)  General appearance: alert, cooperative and appears stated age Breasts: normal appearance, no masses or tenderness Abdomen: soft, non-tender; bowel sounds normal; no masses,  no organomegaly Lymph nodes: Cervical, supraclavicular, and axillary nodes normal.  No abnormal inguinal nodes palpated Neurologic: Grossly normal  Pelvic: External genitalia:  no lesions              Urethra:  normal appearing urethra with no masses, tenderness or lesions              Bartholins and Skenes: normal                  Vagina: normal appearing vagina with atrophic changes and no discharge, no lesions              Cervix: no lesions              Pap taken: No. Bimanual Exam:  Uterus:  normal size, contour, position, consistency, mobility, non-tender              Adnexa: normal adnexa and no mass, fullness, tenderness               Rectovaginal: Confirms               Anus:  normal sphincter tone, no lesions  Chaperone, Ina Homes, CMA, was present for exam.  Assessment/Plan: There are no diagnoses linked to this encounter.

## 2023-07-07 ENCOUNTER — Other Ambulatory Visit: Payer: Self-pay

## 2023-07-07 ENCOUNTER — Ambulatory Visit (INDEPENDENT_AMBULATORY_CARE_PROVIDER_SITE_OTHER): Admitting: Allergy & Immunology

## 2023-07-07 ENCOUNTER — Encounter: Payer: Self-pay | Admitting: Allergy & Immunology

## 2023-07-07 VITALS — BP 120/74 | HR 73 | Temp 98.3°F | Resp 16 | Ht 65.0 in | Wt 178.5 lb

## 2023-07-07 DIAGNOSIS — J3089 Other allergic rhinitis: Secondary | ICD-10-CM | POA: Diagnosis not present

## 2023-07-07 DIAGNOSIS — J302 Other seasonal allergic rhinitis: Secondary | ICD-10-CM | POA: Diagnosis not present

## 2023-07-07 DIAGNOSIS — J45990 Exercise induced bronchospasm: Secondary | ICD-10-CM

## 2023-07-07 NOTE — Patient Instructions (Addendum)
 1. Seasonal and perennial allergic rhinitis - We will look for your vials and get your outside records. - Continue on Allegra once daily. - Continue with the ipratropium two sprays per nostril twice daily as needed. - EpiePen already in placed.  - We will get this all sorted out!   2. Exercise induced bronchospasm - Continue with albuterol as needed. - Let us  know if you ever needed.  3. Return in about 1 year (around 07/06/2024). You can have the follow up appointment with Dr. Idolina Maker or a Nurse Practicioner (our Nurse Practitioners are excellent and always have Physician oversight!).    Please inform us  of any Emergency Department visits, hospitalizations, or changes in symptoms. Call us  before going to the ED for breathing or allergy symptoms since we might be able to fit you in for a sick visit. Feel free to contact us  anytime with any questions, problems, or concerns.  It was a pleasure to meet you today!  Websites that have reliable patient information: 1. American Academy of Asthma, Allergy, and Immunology: www.aaaai.org 2. Food Allergy Research and Education (FARE): foodallergy.org 3. Mothers of Asthmatics: http://www.asthmacommunitynetwork.org 4. American College of Allergy, Asthma, and Immunology: www.acaai.org      "Like" us  on Facebook and Instagram for our latest updates!      A healthy democracy works best when [employer] participate! Make sure you are registered to vote! If you have moved or changed any of your contact information, you will need to get this updated before voting! Scan the QR codes below to learn more!

## 2023-07-07 NOTE — Progress Notes (Signed)
 NEW PATIENT  Date of Service/Encounter:  07/07/23  Consult requested by: Jeannine Milroy., MD   Assessment:   Seasonal and perennial allergic rhinitis - on allergen immunotherapy from her practice in [place]  the vials) (working on getting   Exercise induced bronchospasm - has not used albuterol in years  [facility]  Plan/Recommendations:   1. Seasonal and perennial allergic rhinitis - We will look for your vials and get your outside records. - Continue on Allegra once daily. - Continue with the ipratropium two sprays per nostril twice daily as needed. - EpiePen already in placed.  - We will get this all sorted out!   2. Exercise induced bronchospasm - Continue with albuterol as needed. - Let us  know if you ever needed.  3. Return in about 1 year (around 07/06/2024). You can have the follow up appointment with Dr. Idolina Maker or a Nurse Practicioner (our Nurse Practitioners are excellent and always have Physician oversight!).    This note in its entirety was forwarded to the Provider who requested this consultation.  Subjective:   Olivia Ellison is a 68 y.o. female presenting today for evaluation of  Chief Complaint  Patient presents with   Establish Care    Seen an allergist in [facility] and received allergy injections. Would like to transfer care.     Olivia Ellison has a history of the following: Patient Active Problem List   Diagnosis Date Noted   Multiple thyroid  nodules 08/26/2021   HSV infection 08/04/2020   Migraine with aura and without status migrainosus, not intractable 08/04/2020   Hypothyroidism 08/04/2020   Atrophic vaginitis 02/03/2015   Hypothyroidism 07/13/2013    History obtained from: chart review and patient.  Discussed the use of AI scribe software for clinical note transcription with the patient and/or guardian, who gave verbal consent to proceed.  Olivia Ellison was referred by Jeannine Milroy., MD.     Olivia Ellison is a 68 y.o. female  presenting for an evaluation of allergies .  She has been receiving allergy shots in [place]  for the past four months, administered once a week with one shot in each arm. She is currently on the fourth vial. However, she has not received any shots since April 18th due to a delay in the transfer of her vials to her current location. She has left messages with the clinic in [place]  but has not received a response. She called them several times.   She takes Allegra daily and uses a nasal spray, Ipratropium, twice in each nostril for her allergies. She does not have an EpiPen and has an old one at home. No known food allergies. She denies having asthma, although she was once diagnosed with exertional asthma and was given an albuterol inhaler, which she has never used.  She has never been on prednisone for her symptoms.  She has not been to the hospital for her symptoms.  He has been  Her family history includes severe allergies in her mother and brother. She did not experience allergies until a few years ago, following a septoplasty performed last year in [place] . No eczema, hives, ear infections, or pneumonia, but she occasionally experiences sinus infections.  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 She has no children but used to have dogs.   Otherwise, there is no history of other atopic diseases, including drug allergies, stinging insect allergies, or contact dermatitis. There is no significant infectious  history. Vaccinations are up to date.    Past Medical History: Patient Active Problem List   Diagnosis Date Noted   Multiple thyroid  nodules 08/26/2021   HSV infection 08/04/2020   Migraine with aura and without status migrainosus, not intractable 08/04/2020   Hypothyroidism 08/04/2020   Atrophic vaginitis 02/03/2015   Hypothyroidism 07/13/2013    Medication List:  Allergies as of 07/07/2023       Reactions   Osphena   [ospemifene ] Other (See Comments)   Muscle spasms   Other Other (See Comments)   Bandaids---redness        Medication List        Accurate as of Jul 07, 2023  2:47 PM. If you have any questions, ask your nurse or doctor.          BIOTIN PO Take 5,000 mcg by mouth daily.   CALCIUM 1200 PO Take by mouth daily.   fexofenadine 180 MG tablet Commonly known as: ALLEGRA Take 180 mg by mouth as needed.   ipratropium 0.06 % nasal spray Commonly known as: ATROVENT SMARTSIG:1-2 Spray(s) Both Nares 4 Times Daily PRN   Krill Oil 350 MG Caps Take by mouth daily.   magnesium 30 MG tablet Take 30 mg by mouth 2 (two) times daily.   MULTIVITAMIN PO Take by mouth daily.   PROBIOTIC PO Take by mouth daily.   rizatriptan  10 MG disintegrating tablet Commonly known as: MAXALT -MLT Take 1 tablet (10 mg total) by mouth as needed for migraine. May repeat in 2 hours if needed   Synthroid  100 MCG tablet Generic drug: levothyroxine Take 1 tablet (100 mcg total) by mouth daily.   valACYclovir  500 MG tablet Commonly known as: VALTREX  1 tablet BID for 3 days with symptoms.   VITAMIN B 12 PO Take by mouth daily.   VITAMIN D PO Take by mouth daily.        Birth History: non-contributory  Developmental History: non-contributory  Past Surgical History: Past Surgical History:  Procedure Laterality Date   BLEPHAROPLASTY  2012       CERVIX LESION DESTRUCTION  1990   chemical tx'd x2, cone bx and laser   COLONOSCOPY     SEPTOPLASTY  06/06/2022   in [place]      Family History: Family History  Problem Relation Age of Onset   Allergic rhinitis Mother    Allergic rhinitis Brother    Parkinson's disease Brother    Allergic rhinitis Brother    Heart attack Brother    Colon cancer Paternal Uncle    Colon cancer Paternal Uncle    Colon polyps Neg Hx    Esophageal cancer Neg Hx    Rectal cancer Neg Hx    Stomach cancer Neg Hx      Social History: Olivia Ellison lives at home  with her husband.  She has in a house that is 11 years old.  There is wooded area rugs throughout the home.  She has well carpeting in the bedroom.  They have gas heating and central cooling.  There are no animals inside or outside of the home.  There are just mite covers on the bed and the pillows.  There is no tobacco exposure.  She is currently retired.  They do have a HEPA filter.  There is no fume, chemical, or dust exposure.   Review of systems otherwise negative other than that mentioned in the HPI.    Objective:   Blood pressure 120/74, pulse 73, temperature 98.3 F (36.8 C), temperature  source Temporal, resp. rate 16, height 5\' 5"  (1.651 m), weight 178 lb 8 oz (81 kg), last menstrual period 12/03/2010, SpO2 97%. Body mass index is 29.7 kg/m.     Physical Exam Vitals reviewed.  Constitutional:      Appearance: She is well-developed.     Comments: Laughing.  HENT:     Head: Normocephalic and atraumatic.     Right Ear: Tympanic membrane, ear canal and external ear normal. No drainage, swelling or tenderness. Tympanic membrane is not injected, scarred, erythematous, retracted or bulging.     Left Ear: Tympanic membrane, ear canal and external ear normal. No drainage, swelling or tenderness. Tympanic membrane is not injected, scarred, erythematous, retracted or bulging.     Nose: No nasal deformity, septal deviation, mucosal edema or rhinorrhea.     Right Sinus: No maxillary sinus tenderness or frontal sinus tenderness.     Left Sinus: No maxillary sinus tenderness or frontal sinus tenderness.     Mouth/Throat:     Mouth: Mucous membranes are not pale and not dry.     Pharynx: Uvula midline.  Eyes:     General:        Right eye: No discharge.        Left eye: No discharge.     Conjunctiva/sclera: Conjunctivae normal.     Right eye: Right conjunctiva is not injected. No chemosis.    Left eye: Left conjunctiva is not injected. No chemosis.    Pupils: Pupils are equal, round,  and reactive to light.  Cardiovascular:     Rate and Rhythm: Normal rate and regular rhythm.     Heart sounds: Normal heart sounds.  Pulmonary:     Effort: Pulmonary effort is normal. No tachypnea, accessory muscle usage or respiratory distress.     Breath sounds: Normal breath sounds. No wheezing, rhonchi or rales.  Chest:     Chest wall: No tenderness.  Abdominal:     Tenderness: There is no abdominal tenderness. There is no guarding or rebound.  Lymphadenopathy:     Head:     Right side of head: No submandibular, tonsillar or occipital adenopathy.     Left side of head: No submandibular, tonsillar or occipital adenopathy.     Cervical: No cervical adenopathy.  Skin:    Coloration: Skin is not pale.     Findings: No abrasion, erythema, petechiae or rash. Rash is not papular, urticarial or vesicular.  Neurological:     Mental Status: She is alert.  Psychiatric:        Behavior: Behavior is cooperative.      Diagnostic studies: none         Drexel Gentles, MD [facility] 

## 2023-07-08 ENCOUNTER — Ambulatory Visit (INDEPENDENT_AMBULATORY_CARE_PROVIDER_SITE_OTHER): Payer: Self-pay

## 2023-07-08 DIAGNOSIS — J309 Allergic rhinitis, unspecified: Secondary | ICD-10-CM

## 2023-07-11 ENCOUNTER — Other Ambulatory Visit: Payer: Self-pay | Admitting: Internal Medicine

## 2023-07-11 DIAGNOSIS — E785 Hyperlipidemia, unspecified: Secondary | ICD-10-CM

## 2023-07-15 ENCOUNTER — Ambulatory Visit (INDEPENDENT_AMBULATORY_CARE_PROVIDER_SITE_OTHER): Payer: Self-pay

## 2023-07-15 ENCOUNTER — Ambulatory Visit: Admitting: Allergy & Immunology

## 2023-07-15 DIAGNOSIS — J309 Allergic rhinitis, unspecified: Secondary | ICD-10-CM | POA: Diagnosis not present

## 2023-07-22 ENCOUNTER — Ambulatory Visit (INDEPENDENT_AMBULATORY_CARE_PROVIDER_SITE_OTHER)

## 2023-07-22 DIAGNOSIS — J309 Allergic rhinitis, unspecified: Secondary | ICD-10-CM

## 2023-07-25 ENCOUNTER — Ambulatory Visit: Payer: Self-pay

## 2023-07-29 ENCOUNTER — Ambulatory Visit (INDEPENDENT_AMBULATORY_CARE_PROVIDER_SITE_OTHER): Payer: Self-pay

## 2023-07-29 DIAGNOSIS — J309 Allergic rhinitis, unspecified: Secondary | ICD-10-CM

## 2023-07-30 ENCOUNTER — Other Ambulatory Visit

## 2023-07-31 ENCOUNTER — Ambulatory Visit
Admission: RE | Admit: 2023-07-31 | Discharge: 2023-07-31 | Disposition: A | Discharge status: Home / Self Care | Source: Ambulatory Visit | Attending: Internal Medicine | Admitting: Internal Medicine

## 2023-07-31 DIAGNOSIS — E785 Hyperlipidemia, unspecified: Secondary | ICD-10-CM

## 2023-08-06 ENCOUNTER — Other Ambulatory Visit: Payer: Self-pay | Admitting: Obstetrics & Gynecology

## 2023-08-06 ENCOUNTER — Ambulatory Visit (INDEPENDENT_AMBULATORY_CARE_PROVIDER_SITE_OTHER): Payer: Self-pay

## 2023-08-06 DIAGNOSIS — J309 Allergic rhinitis, unspecified: Secondary | ICD-10-CM

## 2023-08-06 DIAGNOSIS — Z1231 Encounter for screening mammogram for malignant neoplasm of breast: Secondary | ICD-10-CM

## 2023-08-11 ENCOUNTER — Encounter

## 2023-08-12 ENCOUNTER — Ambulatory Visit (AMBULATORY_SURGERY_CENTER): Admitting: *Deleted

## 2023-08-12 ENCOUNTER — Ambulatory Visit (INDEPENDENT_AMBULATORY_CARE_PROVIDER_SITE_OTHER): Payer: Self-pay

## 2023-08-12 VITALS — Ht 65.0 in | Wt 169.0 lb

## 2023-08-12 DIAGNOSIS — J309 Allergic rhinitis, unspecified: Secondary | ICD-10-CM

## 2023-08-12 DIAGNOSIS — Z800 Family history of malignant neoplasm of digestive organs: Secondary | ICD-10-CM

## 2023-08-12 DIAGNOSIS — Z860100 Personal history of colon polyps, unspecified: Secondary | ICD-10-CM

## 2023-08-12 MED ORDER — NA SULFATE-K SULFATE-MG SULF 17.5-3.13-1.6 GM/177ML PO SOLN: 1.0000 | Freq: Once | ORAL | 0 refills | Status: AC

## 2023-08-12 NOTE — Progress Notes (Signed)
 Pt's name and DOB verified at the beginning of the pre-visit wit 2 identifiers  Pt denies any difficulty with ambulating,sitting, laying down or rolling side to side  Pt has no issues moving head neck or swallowing  No egg or soy allergy known to patient   No issues known to pt with past sedation with any surgeries or procedures  No FH of Malignant Hyperthermia  Pt is not on home 02   Pt is not on blood thinners   Pt denies issues with constipation   Pt is not on dialysis  Pt denise any abnormal heart rhythms   Pt denies any upcoming cardiac testing  Patient's chart reviewed by Rogena Class CNRA prior to pre-visit and patient appropriate for the [facility].  Pre-visit completed and red dot placed by patient's name on their procedure day (on provider's schedule).    Visit by phone  Pt states weight is 169 lb  IInstructions reviewed. Pt given  both [facility] main # and MD on call # prior to instructions.  Pt states understanding of instructions. Instructed pt to review instructions again prior to procedure and call main # given if has questions.. Pt states they will.   Instructed pt on where to find instructions on My Chart.

## 2023-08-13 ENCOUNTER — Ambulatory Visit
Admission: RE | Admit: 2023-08-13 | Discharge: 2023-08-13 | Discharge status: Home / Self Care | Payer: Self-pay | Source: Ambulatory Visit | Attending: Obstetrics & Gynecology | Admitting: Obstetrics & Gynecology

## 2023-08-13 DIAGNOSIS — Z1231 Encounter for screening mammogram for malignant neoplasm of breast: Secondary | ICD-10-CM

## 2023-08-19 ENCOUNTER — Ambulatory Visit (INDEPENDENT_AMBULATORY_CARE_PROVIDER_SITE_OTHER)

## 2023-08-19 DIAGNOSIS — J309 Allergic rhinitis, unspecified: Secondary | ICD-10-CM | POA: Diagnosis not present

## 2023-08-26 ENCOUNTER — Ambulatory Visit (INDEPENDENT_AMBULATORY_CARE_PROVIDER_SITE_OTHER)

## 2023-08-26 DIAGNOSIS — J309 Allergic rhinitis, unspecified: Secondary | ICD-10-CM | POA: Diagnosis not present

## 2023-09-02 ENCOUNTER — Encounter: Payer: Self-pay | Admitting: Gastroenterology

## 2023-09-02 ENCOUNTER — Ambulatory Visit: Admitting: Gastroenterology

## 2023-09-02 VITALS — BP 122/66 | HR 56 | Temp 97.6°F | Resp 19 | Ht 65.0 in | Wt 169.0 lb

## 2023-09-02 DIAGNOSIS — Z1211 Encounter for screening for malignant neoplasm of colon: Secondary | ICD-10-CM | POA: Diagnosis not present

## 2023-09-02 DIAGNOSIS — D123 Benign neoplasm of transverse colon: Secondary | ICD-10-CM

## 2023-09-02 DIAGNOSIS — K635 Polyp of colon: Secondary | ICD-10-CM | POA: Diagnosis not present

## 2023-09-02 DIAGNOSIS — K644 Residual hemorrhoidal skin tags: Secondary | ICD-10-CM | POA: Diagnosis not present

## 2023-09-02 DIAGNOSIS — K640 First degree hemorrhoids: Secondary | ICD-10-CM | POA: Diagnosis not present

## 2023-09-02 DIAGNOSIS — Z860100 Personal history of colon polyps, unspecified: Secondary | ICD-10-CM

## 2023-09-02 DIAGNOSIS — Z800 Family history of malignant neoplasm of digestive organs: Secondary | ICD-10-CM

## 2023-09-02 MED ORDER — SODIUM CHLORIDE 0.9 % IV SOLN: 500.0000 mL | Freq: Once | INTRAVENOUS | Status: DC

## 2023-09-02 NOTE — Op Note (Signed)
 [facility] Patient Name: Olivia Ellison Procedure Date: 09/02/2023 7:49 AM MRN: [id number] Endoscopist: Glendia E. Stacia , MD, [id number] Age: 68 Referring MD:  Date of Birth: April 28, 1955 Gender: Female Account #: [id number] Procedure:                Colonoscopy Indications:              High risk colon cancer surveillance: Personal                            history of sessile serrated colon polyp (less than                            10 mm in size) with no dysplasia, Last colonoscopy:                            July 2020 Medicines:                Monitored Anesthesia Care Procedure:                Pre-Anesthesia Assessment:                           - Prior to the procedure, a History and Physical                            was performed, and patient medications and                            allergies were reviewed. The patient's tolerance of                            previous anesthesia was also reviewed. The risks                            and benefits of the procedure and the sedation                            options and risks were discussed with the patient.                            All questions were answered, and informed consent                            was obtained. Prior Anticoagulants: The patient has                            taken no anticoagulant or antiplatelet agents. ASA                            Grade Assessment: II - A patient with mild systemic                            disease. After reviewing the risks and benefits,  the patient was deemed in satisfactory condition to                            undergo the procedure.                           After obtaining informed consent, the colonoscope                            was passed under direct vision. Throughout the                            procedure, the patient's blood pressure, pulse, and                            oxygen saturations were monitored continuously.  The                            CF [id number] #[id number] was introduced through the anus                            and advanced to the the cecum, identified by                            appendiceal orifice and ileocecal valve. The                            colonoscopy was performed without difficulty. The                            patient tolerated the procedure well. The quality                            of the bowel preparation was excellent. The                            ileocecal valve, appendiceal orifice, and rectum                            were photographed. The bowel preparation used was                            SUPREP via split dose instruction. Scope In: 8:10:08 AM Scope Out: 8:24:48 AM Scope Withdrawal Time: 0 hours 10 minutes 46 seconds  Total Procedure Duration: 0 hours 14 minutes 40 seconds  Findings:                 Skin tags were found on perianal exam.                           The digital rectal exam was normal. Pertinent                            negatives include normal sphincter tone and no  palpable rectal lesions.                           A 3 mm polyp was found in the splenic flexure. The                            polyp was flat. The polyp was removed with a cold                            snare. Resection and retrieval were complete.                            Estimated blood loss was minimal.                           The exam was otherwise normal throughout the                            examined colon.                           Non-bleeding internal hemorrhoids were found during                            retroflexion. The hemorrhoids were Grade I                            (internal hemorrhoids that do not prolapse).                           No additional abnormalities were found on                            retroflexion. Complications:            No immediate complications. Estimated Blood Loss:     Estimated blood loss was  minimal. Impression:               - Perianal skin tags found on perianal exam.                           - One 3 mm polyp at the splenic flexure, removed                            with a cold snare. Resected and retrieved.                           - Non-bleeding internal hemorrhoids. Recommendation:           - Patient has a contact number available for                            emergencies. The signs and symptoms of potential                            delayed complications were discussed with the  patient. Return to normal activities tomorrow.                            Written discharge instructions were provided to the                            patient.                           - Resume previous diet.                           - Continue present medications.                           - Await pathology results.                           - Repeat colonoscopy (date not yet determined) for                            surveillance based on pathology results. Drena Ham E. Stacia, MD 09/02/2023 8:33:34 AM This report has been signed electronically.

## 2023-09-02 NOTE — Progress Notes (Signed)
 [facility] History and Physical   Primary Care Physician:  Loreli Elsie JONETTA Mickey., MD   Reason for Procedure:   Colon cancer screening/history of polyps  Plan:    Surveillance colonoscopy     HPI: Olivia Ellison is a 68 y.o. female undergoing surveillance colonoscopy.  She has no chronic GI symptoms.  Her last colonoscopy was in 2020 and 2 sessile serrated polyps were removed.  She has two uncles with colon cancer on her dad's side.   Past Medical History:  Diagnosis Date   Abnormal Pap smear of cervix    chemicl tx, cone bx, and laser, >20+ years ago   Allergy    Arthritis    Atypical migraine    Goiter    multinodular goiter--negative bx    Hyperlipidemia    Thyroid  disease    hypothyroidism    Past Surgical History:  Procedure Laterality Date   BLEPHAROPLASTY  2012       CERVIX LESION DESTRUCTION  1990   chemical tx'd x2, cone bx and laser   COLONOSCOPY  2020   [facility]- 5- SSP's   COLONOSCOPY  2009   SEPTOPLASTY  06/06/2022   in [place]     Prior to Admission medications   Medication Sig Start Date End Date Taking? Authorizing Provider  Ascorbic Acid (VITAMIN C) 500 MG CAPS Take by mouth.   Yes [provider]  BIOTIN PO Take 5,000 mcg by mouth daily.    Yes [provider]  Calcium Carbonate-Vit D-Min (CALCIUM 1200 PO) Take by mouth daily.   Yes [provider]  Cholecalciferol (VITAMIN D PO) Take by mouth daily.   Yes [provider]  Cyanocobalamin (VITAMIN B 12 PO) Take by mouth daily.   Yes [provider]  fexofenadine (ALLEGRA) 180 MG tablet Take 180 mg by mouth as needed.   Yes [provider]  ipratropium (ATROVENT) 0.06 % nasal spray SMARTSIG:1-2 Spray(s) Both Nares 4 Times Daily PRN 06/08/23  Yes [provider]  Anselm Oil 350 MG CAPS Take by mouth daily.   Yes [provider]  magnesium 30 MG tablet Take 30 mg by mouth 2 (two) times daily.   Yes [provider]   Multiple Vitamins-Minerals (MULTIVITAMIN PO) Take by mouth daily.   Yes [provider]  Probiotic Product (PROBIOTIC PO) Take by mouth daily.   Yes [provider]  SYNTHROID  100 MCG tablet Take 1 tablet (100 mcg total) by mouth daily. 09/16/22 12/10/23 Yes Cleotilde Ronal RAMAN, MD  Zinc 30 MG TABS Take by mouth.   Yes [provider]  rizatriptan  (MAXALT -MLT) 10 MG disintegrating tablet Take 1 tablet (10 mg total) by mouth as needed for migraine. May repeat in 2 hours if needed Patient not taking: Reported on 09/02/2023 12/14/20   Ines Onetha NOVAK, MD  valACYclovir  (VALTREX ) 500 MG tablet 1 tablet BID for 3 days with symptoms. 09/16/22   Cleotilde Ronal RAMAN, MD    Current Outpatient Medications  Medication Sig Dispense Refill   Ascorbic Acid (VITAMIN C) 500 MG CAPS Take by mouth.     BIOTIN PO Take 5,000 mcg by mouth daily.      Calcium Carbonate-Vit D-Min (CALCIUM 1200 PO) Take by mouth daily.     Cholecalciferol (VITAMIN D PO) Take by mouth daily.     Cyanocobalamin (VITAMIN B 12 PO) Take by mouth daily.     fexofenadine (ALLEGRA) 180 MG tablet Take 180 mg by mouth as needed.  ipratropium (ATROVENT) 0.06 % nasal spray SMARTSIG:1-2 Spray(s) Both Nares 4 Times Daily PRN     Krill Oil 350 MG CAPS Take by mouth daily.     magnesium 30 MG tablet Take 30 mg by mouth 2 (two) times daily.     Multiple Vitamins-Minerals (MULTIVITAMIN PO) Take by mouth daily.     Probiotic Product (PROBIOTIC PO) Take by mouth daily.     SYNTHROID  100 MCG tablet Take 1 tablet (100 mcg total) by mouth daily. 90 tablet 4   Zinc 30 MG TABS Take by mouth.     rizatriptan  (MAXALT -MLT) 10 MG disintegrating tablet Take 1 tablet (10 mg total) by mouth as needed for migraine. May repeat in 2 hours if needed (Patient not taking: Reported on 09/02/2023) 9 tablet 11   valACYclovir  (VALTREX ) 500 MG tablet 1 tablet BID for 3 days with symptoms. 30 tablet 2   Current Facility-Administered Medications  Medication  Dose Route Frequency Provider Last Rate Last Admin   0.9 %  sodium chloride  infusion  500 mL Intravenous Once Aneita Gist T, MD       0.9 %  sodium chloride  infusion  500 mL Intravenous Once Stacia Glendia BRAVO, MD        Allergies as of 09/02/2023 - Review Complete 09/02/2023  Allergen Reaction Noted   Covid-19 (adenovirus) vaccine Other (See Comments) 09/02/2023   Osphena  [ospemifene ] Other (See Comments) 02/01/2014   Other Other (See Comments) 10/02/2012    Family History  Problem Relation Age of Onset   Allergic rhinitis Mother    Allergic rhinitis Brother    Parkinson's disease Brother    Allergic rhinitis Brother    Heart attack Brother    Colon cancer Paternal Uncle    Colon cancer Paternal Uncle    Colon polyps Neg Hx    Esophageal cancer Neg Hx    Rectal cancer Neg Hx    Stomach cancer Neg Hx     Social History   Socioeconomic History   Marital status: Married    Spouse name: Not on file   Number of children: Not on file   Years of education: Not on file   Highest education level: Not on file  Occupational History   Not on file  Tobacco Use   Smoking status: Never   Smokeless tobacco: Never  Vaping Use   Vaping status: Never Used  Substance and Sexual Activity   Alcohol  use: Yes    Alcohol /week: 2.0 - 3.0 standard drinks of alcohol     Types: 2 - 3 Standard drinks or equivalent per week    Comment: occ   Drug use: No   Sexual activity: Yes    Partners: Male    Birth control/protection: Post-menopausal  Other Topics Concern   Not on file  Social History Narrative   Not on file   Social Drivers of Health   Financial Resource Strain: Not on file  Food Insecurity: Not on file  Transportation Needs: Not on file  Physical Activity: Not on file  Stress: Not on file  Social Connections: Not on file  Intimate Partner Violence: Not on file    Review of Systems:  All other review of systems negative except as mentioned in the HPI.  Physical  Exam: Vital signs BP 119/82   Pulse 70   Temp 97.6 F (36.4 C)   Ht 5' 5 (1.651 m)   Wt 169 lb (76.7 kg)   LMP 12/03/2010   SpO2 98%   BMI  28.12 kg/m   General:   Alert,  Well-developed, well-nourished, pleasant and cooperative in NAD Airway:  Mallampati 2 Lungs:  Clear throughout to auscultation.   Heart:  Regular rate and rhythm; no murmurs, clicks, rubs,  or gallops. Abdomen:  Soft, nontender and nondistended. Normal bowel sounds.   Neuro/Psych:  Normal mood and affect. A and O x 3   Shameika Speelman E. Stacia, MD [facility]

## 2023-09-02 NOTE — Progress Notes (Signed)
 Called to room to assist during endoscopic procedure.  Patient ID and intended procedure confirmed with present staff. Received instructions for my participation in the procedure from the performing physician.

## 2023-09-02 NOTE — Progress Notes (Signed)
 Pt's states no medical or surgical changes since previsit or office visit.

## 2023-09-02 NOTE — Patient Instructions (Addendum)
 Resume previous diet Continue present medications Await pathology results Handouts/information given for polyps and hemorrhoids  YOU HAD AN ENDOSCOPIC PROCEDURE TODAY AT THE [facility]:   Refer to the procedure report that was given to you for any specific questions about what was found during the examination.  If the procedure report does not answer your questions, please call your gastroenterologist to clarify.  If you requested that your care partner not be given the details of your procedure findings, then the procedure report has been included in a sealed envelope for you to review at your convenience later.  YOU SHOULD EXPECT: Some feelings of bloating in the abdomen. Passage of more gas than usual.  Walking can help get rid of the air that was put into your GI tract during the procedure and reduce the bloating. If you had a lower endoscopy (such as a colonoscopy or flexible sigmoidoscopy) you may notice spotting of blood in your stool or on the toilet paper. If you underwent a bowel prep for your procedure, you may not have a normal bowel movement for a few days.  Please Note:  You might notice some irritation and congestion in your nose or some drainage.  This is from the oxygen used during your procedure.  There is no need for concern and it should clear up in a day or so.  SYMPTOMS TO REPORT IMMEDIATELY:  Following lower endoscopy (colonoscopy):  Excessive amounts of blood in the stool  Significant tenderness or worsening of abdominal pains  Swelling of the abdomen that is new, acute  Fever of 100F or higher  For urgent or emergent issues, a gastroenterologist can be reached at any hour by calling [phone]. Do not use MyChart messaging for urgent concerns.   DIET:  We do recommend a small meal at first, but then you may proceed to your regular diet.  Drink plenty of fluids but you should avoid alcoholic beverages for 24 hours.  ACTIVITY:  You should plan to take  it easy for the rest of today and you should NOT DRIVE or use heavy machinery until tomorrow (because of the sedation medicines used during the test).    FOLLOW UP: Our staff will call the number listed on your records the next business day following your procedure.  We will call around 7:15- 8:00 am to check on you and address any questions or concerns that you may have regarding the information given to you following your procedure. If we do not reach you, we will leave a message.     If any biopsies were taken you will be contacted by phone or by letter within the next 1-3 weeks.  Please call us at [phone] if you have not heard about the biopsies in 3 weeks.    SIGNATURES/CONFIDENTIALITY: You and/or your care partner have signed paperwork which will be entered into your electronic medical record.  These signatures attest to the fact that that the information above on your After Visit Summary has been reviewed and is understood.  Full responsibility of the confidentiality of this discharge information lies with you and/or your care-partner.

## 2023-09-02 NOTE — Progress Notes (Signed)
 Sedate, gd SR, tolerated procedure well, VSS, report to RN

## 2023-09-03 ENCOUNTER — Ambulatory Visit (INDEPENDENT_AMBULATORY_CARE_PROVIDER_SITE_OTHER)

## 2023-09-03 ENCOUNTER — Telehealth: Payer: Self-pay

## 2023-09-03 DIAGNOSIS — J309 Allergic rhinitis, unspecified: Secondary | ICD-10-CM

## 2023-09-03 NOTE — Telephone Encounter (Signed)
  Follow up Call-     09/02/2023    7:38 AM  Call back number  Post procedure Call Back phone  # [phone]  Permission to leave phone message Yes     Patient questions:  Do you have a fever, pain , or abdominal swelling? No. Pain Score  0 *  Have you tolerated food without any problems? Yes.    Have you been able to return to your normal activities? Yes.    Do you have any questions about your discharge instructions: Diet   No. Medications  No. Follow up visit  No.  Do you have questions or concerns about your Care? No.  Actions: * If pain score is 4 or above: No action needed, pain <4.

## 2023-09-08 LAB — SURGICAL PATHOLOGY

## 2023-09-09 ENCOUNTER — Ambulatory Visit (INDEPENDENT_AMBULATORY_CARE_PROVIDER_SITE_OTHER)

## 2023-09-09 DIAGNOSIS — J309 Allergic rhinitis, unspecified: Secondary | ICD-10-CM | POA: Diagnosis not present

## 2023-09-11 ENCOUNTER — Ambulatory Visit: Payer: Self-pay | Admitting: Gastroenterology

## 2023-09-11 NOTE — Progress Notes (Signed)
 Olivia Ellison, Dancy news: the polyp that I removed during your recent examination was NOT precancerous.  You should continue to follow current colorectal cancer screening guidelines with a repeat colonoscopy in 7-10 years.    If you develop any new rectal bleeding, abdominal pain or significant bowel habit changes, please contact me before then.

## 2023-09-15 ENCOUNTER — Ambulatory Visit (INDEPENDENT_AMBULATORY_CARE_PROVIDER_SITE_OTHER)

## 2023-09-15 DIAGNOSIS — J309 Allergic rhinitis, unspecified: Secondary | ICD-10-CM

## 2023-09-15 MED ORDER — EPINEPHRINE 0.3 MG/0.3ML IJ SOAJ: 0.3000 mg | INTRAMUSCULAR | 1 refills | Status: AC | PRN

## 2023-09-23 ENCOUNTER — Ambulatory Visit (INDEPENDENT_AMBULATORY_CARE_PROVIDER_SITE_OTHER)

## 2023-09-23 DIAGNOSIS — J309 Allergic rhinitis, unspecified: Secondary | ICD-10-CM

## 2023-10-06 ENCOUNTER — Ambulatory Visit (INDEPENDENT_AMBULATORY_CARE_PROVIDER_SITE_OTHER)

## 2023-10-06 DIAGNOSIS — J309 Allergic rhinitis, unspecified: Secondary | ICD-10-CM

## 2023-10-20 ENCOUNTER — Ambulatory Visit (INDEPENDENT_AMBULATORY_CARE_PROVIDER_SITE_OTHER)

## 2023-10-20 DIAGNOSIS — J309 Allergic rhinitis, unspecified: Secondary | ICD-10-CM | POA: Diagnosis not present

## 2023-11-10 ENCOUNTER — Ambulatory Visit (INDEPENDENT_AMBULATORY_CARE_PROVIDER_SITE_OTHER)

## 2023-11-10 DIAGNOSIS — J309 Allergic rhinitis, unspecified: Secondary | ICD-10-CM

## 2023-11-24 ENCOUNTER — Ambulatory Visit (INDEPENDENT_AMBULATORY_CARE_PROVIDER_SITE_OTHER)

## 2023-11-24 DIAGNOSIS — J309 Allergic rhinitis, unspecified: Secondary | ICD-10-CM

## 2024-07-28 ENCOUNTER — Ambulatory Visit (HOSPITAL_BASED_OUTPATIENT_CLINIC_OR_DEPARTMENT_OTHER): Payer: Self-pay | Admitting: Obstetrics & Gynecology
